# Patient Record
Sex: Male | Born: 1955 | Race: Black or African American | Hispanic: No | Marital: Single | Smoking: Never smoker
Health system: Southern US, Community
[De-identification: ages and names within clinical notes are randomized; demographics above are authoritative.]

## PROBLEM LIST (undated history)

## (undated) DIAGNOSIS — J45909 Unspecified asthma, uncomplicated: Secondary | ICD-10-CM

## (undated) DIAGNOSIS — F319 Bipolar disorder, unspecified: Secondary | ICD-10-CM

## (undated) DIAGNOSIS — E119 Type 2 diabetes mellitus without complications: Secondary | ICD-10-CM

## (undated) HISTORY — PX: HERNIA REPAIR: SHX51

---

## 2005-10-10 ENCOUNTER — Emergency Department (HOSPITAL_COMMUNITY): Admission: EM | Admit: 2005-10-10 | Discharge: 2005-10-10 | Payer: Self-pay | Admitting: Family Medicine

## 2013-02-17 ENCOUNTER — Ambulatory Visit: Payer: Self-pay | Admitting: Podiatry

## 2013-02-26 ENCOUNTER — Encounter: Payer: Self-pay | Admitting: Podiatry

## 2013-02-26 ENCOUNTER — Ambulatory Visit (INDEPENDENT_AMBULATORY_CARE_PROVIDER_SITE_OTHER): Payer: PRIVATE HEALTH INSURANCE | Admitting: Podiatry

## 2013-02-26 VITALS — BP 90/55 | HR 80 | Ht 63.0 in | Wt 145.0 lb

## 2013-02-26 DIAGNOSIS — M79609 Pain in unspecified limb: Secondary | ICD-10-CM

## 2013-02-26 DIAGNOSIS — E119 Type 2 diabetes mellitus without complications: Secondary | ICD-10-CM

## 2013-02-26 DIAGNOSIS — M79606 Pain in leg, unspecified: Secondary | ICD-10-CM

## 2013-02-26 DIAGNOSIS — B351 Tinea unguium: Secondary | ICD-10-CM | POA: Insufficient documentation

## 2013-02-26 NOTE — Patient Instructions (Signed)
Seen for hypertrophic nails. All nails debrided. Return in 3 months or as needed.  

## 2013-02-26 NOTE — Progress Notes (Signed)
Subjective: 58 y.o. year old male patient presents complaining of painful nails. Patient requests toe nails trimmed.  Stated that her blood sugar was 101 today.  Objective: Dermatologic:  All nails are thick yellow and over grown and curbing under distal end of toes.  No open lesions.  Vascular: Warm feet with palpable Posterior tibial pulses. DP is not palpable on both feet. Orthopedic:  Large bunion on both feet. Neurologic:  All epicritic and tactile sensations grossly intact.  Assessment: Dystrophic mycotic nails x 10. DM under control.   Treatment: All mycotic nails, corns, calluses debrided.

## 2013-05-26 ENCOUNTER — Ambulatory Visit: Payer: PRIVATE HEALTH INSURANCE | Admitting: Podiatry

## 2015-12-13 ENCOUNTER — Encounter (HOSPITAL_COMMUNITY): Payer: Self-pay | Admitting: Emergency Medicine

## 2015-12-13 ENCOUNTER — Emergency Department (HOSPITAL_COMMUNITY)
Admission: EM | Admit: 2015-12-13 | Discharge: 2015-12-13 | Disposition: A | Discharge status: Home / Self Care | Payer: Medicare Other | Attending: Emergency Medicine | Admitting: Emergency Medicine

## 2015-12-13 DIAGNOSIS — Z7984 Long term (current) use of oral hypoglycemic drugs: Secondary | ICD-10-CM | POA: Diagnosis not present

## 2015-12-13 DIAGNOSIS — R531 Weakness: Secondary | ICD-10-CM | POA: Insufficient documentation

## 2015-12-13 DIAGNOSIS — J45909 Unspecified asthma, uncomplicated: Secondary | ICD-10-CM | POA: Insufficient documentation

## 2015-12-13 DIAGNOSIS — E119 Type 2 diabetes mellitus without complications: Secondary | ICD-10-CM | POA: Insufficient documentation

## 2015-12-13 HISTORY — DX: Unspecified asthma, uncomplicated: J45.909

## 2015-12-13 HISTORY — DX: Type 2 diabetes mellitus without complications: E11.9

## 2015-12-13 HISTORY — DX: Bipolar disorder, unspecified: F31.9

## 2015-12-13 LAB — CBG MONITORING, ED: Glucose-Capillary: 133 mg/dL — ABNORMAL HIGH (ref 65–99)

## 2015-12-13 LAB — CBC
HCT: 32.8 % — ABNORMAL LOW (ref 39.0–52.0)
Hemoglobin: 10.5 g/dL — ABNORMAL LOW (ref 13.0–17.0)
MCH: 25.7 pg — AB (ref 26.0–34.0)
MCHC: 32 g/dL (ref 30.0–36.0)
MCV: 80.4 fL (ref 78.0–100.0)
PLATELETS: 135 10*3/uL — AB (ref 150–400)
RBC: 4.08 MIL/uL — ABNORMAL LOW (ref 4.22–5.81)
RDW: 12.7 % (ref 11.5–15.5)
WBC: 8.3 10*3/uL (ref 4.0–10.5)

## 2015-12-13 LAB — URINALYSIS, ROUTINE W REFLEX MICROSCOPIC
BILIRUBIN URINE: NEGATIVE
GLUCOSE, UA: NEGATIVE mg/dL
HGB URINE DIPSTICK: NEGATIVE
KETONES UR: NEGATIVE mg/dL
Leukocytes, UA: NEGATIVE
Nitrite: NEGATIVE
PROTEIN: NEGATIVE mg/dL
Specific Gravity, Urine: 1.018 (ref 1.005–1.030)
pH: 5 (ref 5.0–8.0)

## 2015-12-13 LAB — BASIC METABOLIC PANEL
Anion gap: 14 (ref 5–15)
BUN: 20 mg/dL (ref 6–20)
CALCIUM: 8.8 mg/dL — AB (ref 8.9–10.3)
CHLORIDE: 107 mmol/L (ref 101–111)
CO2: 18 mmol/L — AB (ref 22–32)
CREATININE: 1.42 mg/dL — AB (ref 0.61–1.24)
GFR calc Af Amer: >60 mL/min (ref >60)
GFR calc non Af Amer: 52 mL/min — ABNORMAL LOW (ref >60)
Glucose, Bld: 151 mg/dL — ABNORMAL HIGH (ref 65–99)
Potassium: 4.6 mmol/L (ref 3.5–5.1)
SODIUM: 139 mmol/L (ref 135–145)

## 2015-12-13 NOTE — ED Provider Notes (Signed)
[facility] DEPT Provider Note   CSN: [id number] Arrival date & time: 12/13/15  0203  By signing my name below, I, Arianna Nassar, attest that this documentation has been prepared under the direction and in the presence of Merryl Hacker, MD.  Electronically Signed: Julien Nordmann, ED Scribe. 12/13/15. 2:57 AM.    History   Chief Complaint Chief Complaint  Patient presents with  . Weakness    The history is provided by the patient and the EMS personnel. No language interpreter was used.   HPI Comments: Chad Mathis is a 60 y.o. male who has a PMhx of bipolar disorder and DM presents to the Emergency Department by EMS complaining of resolved, mild weakness of bilateral legs x PTA. Pt states that he was unable to move due to increased weakness in his legs. Pt has a hx of similar sx in the past after taking his medication. He is on depakote, benazepril, lopid, geodon, zocor and desyrel. Pt states the he feels asymptomatic currently. Denies fever, chest pain, shortness of breath, abdominal pain, back pain, dysuria, hematuria, urinary frequency, or urinary urgency. Pt does not have a hx of strokes in the past. Denies dizziness or syncope.  Past Medical History:  Diagnosis Date  . Asthma   . Bipolar 1 disorder ([place])   . Diabetes mellitus without complication [facility])     Patient Active Problem List   Diagnosis Date Noted  . Onychomycosis 02/26/2013  . Pain in lower limb 02/26/2013  . Diabetes mellitus ([place]) 02/26/2013    Past Surgical History:  Procedure Laterality Date  . HERNIA REPAIR         Home Medications    Prior to Admission medications   Medication Sig Start Date End Date Taking? Authorizing Provider  benazepril (LOTENSIN) 10 MG tablet  02/16/13   Historical Provider, MD  divalproex (DEPAKOTE ER) 500 MG 24 hr tablet  02/17/13   Historical Provider, MD  gemfibrozil (LOPID) 600 MG tablet  02/16/13   Historical Provider, MD  levothyroxine (SYNTHROID, LEVOTHROID)  75 MCG tablet  02/16/13   Historical Provider, MD  metFORMIN (GLUCOPHAGE-XR) 500 MG 24 hr tablet  02/16/13   Historical Provider, MD  oxybutynin (DITROPAN-XL) 10 MG 24 hr tablet  02/16/13   Historical Provider, MD  simvastatin (ZOCOR) 40 MG tablet  02/16/13   Historical Provider, MD  traZODone (DESYREL) 100 MG tablet  02/16/13   Historical Provider, MD  ziprasidone (GEODON) 60 MG capsule  02/16/13   Historical Provider, MD    Family History History reviewed. No pertinent family history.  Social History Social History  Substance Use Topics  . Smoking status: Never Smoker  . Smokeless tobacco: Never Used  . Alcohol use No     Allergies   Patient has no known allergies.   Review of Systems Review of Systems  Constitutional: Negative for fever.  Respiratory: Negative for shortness of breath.   Cardiovascular: Negative for chest pain.  Gastrointestinal: Negative for abdominal pain, diarrhea, nausea and vomiting.  Genitourinary: Negative for dysuria, frequency, hematuria and urgency.  Musculoskeletal: Negative for back pain.  Neurological: Positive for weakness. Negative for dizziness and syncope.  All other systems reviewed and are negative.    Physical Exam Updated Vital Signs BP 114/69   Pulse 73   Temp 98.2 F (36.8 C)   Resp 17   Ht 5\' 3"  (1.6 m)   Wt 147 lb (66.7 kg)   SpO2 100%   BMI 26.04 kg/m   Physical Exam  Constitutional: He is oriented to person, place, and time.  Disheveled, no acute distress  HENT:  Head: Normocephalic and atraumatic.  Poor dentition  Eyes: EOM are normal. Pupils are equal, round, and reactive to light.  Cardiovascular: Normal rate, regular rhythm and normal heart sounds.   No murmur heard. Pulmonary/Chest: Effort normal and breath sounds normal. No respiratory distress. He has no wheezes.  Abdominal: Soft. Bowel sounds are normal. There is no tenderness. There is no rebound.  Musculoskeletal: He exhibits no edema.  Neurological: He is  alert and oriented to person, place, and time.  Cranial nerves II through XII intact, 5 out of 5 strength in all 4 extremities, no dysmetria to finger-nose-finger, no drift, normal gait  Skin: Skin is warm and dry.  Psychiatric: He has a normal mood and affect.  Nursing note and vitals reviewed.    ED Treatments / Results  DIAGNOSTIC STUDIES: Oxygen Saturation is 98% on RA, normal by my interpretation.  COORDINATION OF CARE:  2:57 AM Discussed treatment plan with pt at bedside and pt agreed to plan.  Labs (all labs ordered are listed, but only abnormal results are displayed) Labs Reviewed  BASIC METABOLIC PANEL - Abnormal; Notable for the following:       Result Value   CO2 18 (*)    Glucose, Bld 151 (*)    Creatinine, Ser 1.42 (*)    Calcium 8.8 (*)    GFR calc non Af Amer 52 (*)    All other components within normal limits  CBC - Abnormal; Notable for the following:    RBC 4.08 (*)    Hemoglobin 10.5 (*)    HCT 32.8 (*)    MCH 25.7 (*)    Platelets 135 (*)    All other components within normal limits  CBG MONITORING, ED - Abnormal; Notable for the following:    Glucose-Capillary 133 (*)    All other components within normal limits  URINALYSIS, ROUTINE W REFLEX MICROSCOPIC    EKG  EKG Interpretation  Date/Time:  Wednesday December 13 2015 02:22:37 EST Ventricular Rate:  90 PR Interval:    QRS Duration: 87 QT Interval:  353 QTC Calculation: 432 R Axis:   57 Text Interpretation:  Sinus rhythm RSR' in V1 or V2, probably normal variant Confirmed by Zackarie Chason  MD, Loma Sousa ([id number]) on 12/13/2015 2:43:01 AM       Radiology No results found.  Procedures Procedures (including critical care time)  Medications Ordered in ED Medications - No data to display   Initial Impression / Assessment and Plan / ED Course  I have reviewed the triage vital signs and the nursing notes.  Pertinent labs & imaging results that were available during my care of the patient were  reviewed by me and considered in my medical decision making (see chart for details).  Clinical Course     Patient presents with generalized weakness. Reports he had difficulty getting up in the bathroom. Denies lateralizing weakness. Neurologically intact. Noted to be mildly hypotensive by EMS. Her see fluids. On my evaluation is normotensive and asymptomatic. Neurologic exam is reassuring. Basic labwork obtained without significant abnormality. Mild bump in creatinine and anemia. No baseline for comparison. Patient has been without complaint and at his baseline. He is ambulatory independently. Doubt acute emergent process.  After history, exam, and medical workup I feel the patient has been appropriately medically screened and is safe for discharge home. Pertinent diagnoses were discussed with the patient. Patient was given return precautions.  I personally performed the services described in this documentation, which was scribed in my presence. The recorded information has been reviewed and is accurate.  Final Clinical Impressions(s) / ED Diagnoses   Final diagnoses:  Weakness    New Prescriptions New Prescriptions   No medications on file     Merryl Hacker, MD 12/13/15 [phone]

## 2015-12-13 NOTE — ED Triage Notes (Addendum)
Per EMS: Pt at a group home for bipolar disorder. EMS called out for stroke. EMS determined it was generalized weakness. Pt has equal grip strength, face is symmetrical, and no other neurological changes. Pt was hypotensive upon arrival. Pt received 100 mL fluid and last pressure systolic was 871/95.

## 2015-12-13 NOTE — Discharge Instructions (Signed)
You were seen today for bilateral lower extremity weakness. Workup is reassuring. It does not appear having a stroke based on your exam. Your labs are reassuring. Follow-up with your primary physician.

## 2015-12-13 NOTE — ED Notes (Signed)
Pt verbalized understanding of d/c instructions and has no further questions. Pt is stable, A&Ox4, VSS.  

## 2015-12-13 NOTE — ED Notes (Signed)
Pt ambulated in the hallway without assistance and with steady gait.

## 2018-06-04 ENCOUNTER — Other Ambulatory Visit: Payer: Self-pay

## 2018-06-04 ENCOUNTER — Emergency Department (HOSPITAL_COMMUNITY): Payer: Medicare HMO

## 2018-06-04 ENCOUNTER — Inpatient Hospital Stay (HOSPITAL_COMMUNITY): Payer: Medicare HMO

## 2018-06-04 ENCOUNTER — Encounter (HOSPITAL_COMMUNITY): Payer: Self-pay

## 2018-06-04 ENCOUNTER — Inpatient Hospital Stay (HOSPITAL_COMMUNITY)
Admission: EM | Admit: 2018-06-04 | Discharge: 2018-06-20 | DRG: 674 | Disposition: A | Discharge status: Skilled Nursing Facility | Payer: Medicare HMO | Attending: Internal Medicine | Admitting: Internal Medicine

## 2018-06-04 DIAGNOSIS — Z20828 Contact with and (suspected) exposure to other viral communicable diseases: Secondary | ICD-10-CM | POA: Diagnosis present

## 2018-06-04 DIAGNOSIS — R6510 Systemic inflammatory response syndrome (SIRS) of non-infectious origin without acute organ dysfunction: Secondary | ICD-10-CM

## 2018-06-04 DIAGNOSIS — M6282 Rhabdomyolysis: Secondary | ICD-10-CM | POA: Diagnosis present

## 2018-06-04 DIAGNOSIS — N185 Chronic kidney disease, stage 5: Secondary | ICD-10-CM | POA: Diagnosis not present

## 2018-06-04 DIAGNOSIS — N179 Acute kidney failure, unspecified: Principal | ICD-10-CM

## 2018-06-04 DIAGNOSIS — N186 End stage renal disease: Secondary | ICD-10-CM | POA: Diagnosis not present

## 2018-06-04 DIAGNOSIS — E872 Acidosis, unspecified: Secondary | ICD-10-CM

## 2018-06-04 DIAGNOSIS — E11649 Type 2 diabetes mellitus with hypoglycemia without coma: Secondary | ICD-10-CM | POA: Diagnosis not present

## 2018-06-04 DIAGNOSIS — R7989 Other specified abnormal findings of blood chemistry: Secondary | ICD-10-CM

## 2018-06-04 DIAGNOSIS — E861 Hypovolemia: Secondary | ICD-10-CM | POA: Diagnosis present

## 2018-06-04 DIAGNOSIS — Z7984 Long term (current) use of oral hypoglycemic drugs: Secondary | ICD-10-CM | POA: Diagnosis not present

## 2018-06-04 DIAGNOSIS — N250 Renal osteodystrophy: Secondary | ICD-10-CM | POA: Diagnosis present

## 2018-06-04 DIAGNOSIS — I120 Hypertensive chronic kidney disease with stage 5 chronic kidney disease or end stage renal disease: Secondary | ICD-10-CM | POA: Diagnosis present

## 2018-06-04 DIAGNOSIS — E875 Hyperkalemia: Secondary | ICD-10-CM

## 2018-06-04 DIAGNOSIS — I959 Hypotension, unspecified: Secondary | ICD-10-CM | POA: Diagnosis present

## 2018-06-04 DIAGNOSIS — E119 Type 2 diabetes mellitus without complications: Secondary | ICD-10-CM

## 2018-06-04 DIAGNOSIS — Z7982 Long term (current) use of aspirin: Secondary | ICD-10-CM

## 2018-06-04 DIAGNOSIS — G4700 Insomnia, unspecified: Secondary | ICD-10-CM | POA: Diagnosis present

## 2018-06-04 DIAGNOSIS — F319 Bipolar disorder, unspecified: Secondary | ICD-10-CM | POA: Diagnosis present

## 2018-06-04 DIAGNOSIS — Z79890 Hormone replacement therapy (postmenopausal): Secondary | ICD-10-CM

## 2018-06-04 DIAGNOSIS — D72829 Elevated white blood cell count, unspecified: Secondary | ICD-10-CM | POA: Diagnosis present

## 2018-06-04 DIAGNOSIS — R34 Anuria and oliguria: Secondary | ICD-10-CM | POA: Diagnosis present

## 2018-06-04 DIAGNOSIS — D638 Anemia in other chronic diseases classified elsewhere: Secondary | ICD-10-CM

## 2018-06-04 DIAGNOSIS — D631 Anemia in chronic kidney disease: Secondary | ICD-10-CM | POA: Diagnosis present

## 2018-06-04 DIAGNOSIS — F209 Schizophrenia, unspecified: Secondary | ICD-10-CM | POA: Diagnosis present

## 2018-06-04 DIAGNOSIS — E876 Hypokalemia: Secondary | ICD-10-CM | POA: Diagnosis present

## 2018-06-04 DIAGNOSIS — H548 Legal blindness, as defined in USA: Secondary | ICD-10-CM | POA: Diagnosis present

## 2018-06-04 DIAGNOSIS — E1122 Type 2 diabetes mellitus with diabetic chronic kidney disease: Secondary | ICD-10-CM | POA: Diagnosis present

## 2018-06-04 DIAGNOSIS — Z01810 Encounter for preprocedural cardiovascular examination: Secondary | ICD-10-CM | POA: Diagnosis not present

## 2018-06-04 DIAGNOSIS — N183 Chronic kidney disease, stage 3 (moderate): Secondary | ICD-10-CM | POA: Diagnosis not present

## 2018-06-04 DIAGNOSIS — R945 Abnormal results of liver function studies: Secondary | ICD-10-CM | POA: Diagnosis not present

## 2018-06-04 LAB — CBC WITH DIFFERENTIAL/PLATELET
Abs Immature Granulocytes: 0.07 10*3/uL (ref 0.00–0.07)
Basophils Absolute: 0 10*3/uL (ref 0.0–0.1)
Basophils Relative: 0 %
Eosinophils Absolute: 0.1 10*3/uL (ref 0.0–0.5)
Eosinophils Relative: 1 %
HCT: 39.4 % (ref 39.0–52.0)
Hemoglobin: 12.1 g/dL — ABNORMAL LOW (ref 13.0–17.0)
Immature Granulocytes: 1 %
Lymphocytes Relative: 4 %
Lymphs Abs: 0.6 10*3/uL — ABNORMAL LOW (ref 0.7–4.0)
MCH: 25.2 pg — ABNORMAL LOW (ref 26.0–34.0)
MCHC: 30.7 g/dL (ref 30.0–36.0)
MCV: 81.9 fL (ref 80.0–100.0)
Monocytes Absolute: 0.8 10*3/uL (ref 0.1–1.0)
Monocytes Relative: 6 %
Neutro Abs: 12 10*3/uL — ABNORMAL HIGH (ref 1.7–7.7)
Neutrophils Relative %: 88 %
Platelets: 242 10*3/uL (ref 150–400)
RBC: 4.81 MIL/uL (ref 4.22–5.81)
RDW: 14.1 % (ref 11.5–15.5)
WBC: 13.5 10*3/uL — ABNORMAL HIGH (ref 4.0–10.5)
nRBC: 0 % (ref 0.0–0.2)

## 2018-06-04 LAB — COMPREHENSIVE METABOLIC PANEL
ALT: 184 U/L — ABNORMAL HIGH (ref 0–44)
AST: 262 U/L — ABNORMAL HIGH (ref 15–41)
Albumin: 3.8 g/dL (ref 3.5–5.0)
Alkaline Phosphatase: 48 U/L (ref 38–126)
BUN: 88 mg/dL — ABNORMAL HIGH (ref 8–23)
CO2: <7 mmol/L — ABNORMAL LOW (ref 22–32)
Calcium: 8.1 mg/dL — ABNORMAL LOW (ref 8.9–10.3)
Chloride: 97 mmol/L — ABNORMAL LOW (ref 98–111)
Creatinine, Ser: 8.86 mg/dL — ABNORMAL HIGH (ref 0.61–1.24)
GFR calc Af Amer: 7 mL/min — ABNORMAL LOW (ref >60)
GFR calc non Af Amer: 6 mL/min — ABNORMAL LOW (ref >60)
Glucose, Bld: 66 mg/dL — ABNORMAL LOW (ref 70–99)
Potassium: 7 mmol/L (ref 3.5–5.1)
Sodium: 133 mmol/L — ABNORMAL LOW (ref 135–145)
Total Bilirubin: 0.7 mg/dL (ref 0.3–1.2)
Total Protein: 7.3 g/dL (ref 6.5–8.1)

## 2018-06-04 LAB — BASIC METABOLIC PANEL
Anion gap: 28 — ABNORMAL HIGH (ref 5–15)
BUN: 84 mg/dL — ABNORMAL HIGH (ref 8–23)
CO2: 9 mmol/L — ABNORMAL LOW (ref 22–32)
Calcium: 7.4 mg/dL — ABNORMAL LOW (ref 8.9–10.3)
Chloride: 99 mmol/L (ref 98–111)
Creatinine, Ser: 8.35 mg/dL — ABNORMAL HIGH (ref 0.61–1.24)
GFR calc Af Amer: 7 mL/min — ABNORMAL LOW (ref >60)
GFR calc non Af Amer: 6 mL/min — ABNORMAL LOW (ref >60)
Glucose, Bld: 206 mg/dL — ABNORMAL HIGH (ref 70–99)
Potassium: 6.3 mmol/L (ref 3.5–5.1)
Sodium: 136 mmol/L (ref 135–145)

## 2018-06-04 LAB — GLUCOSE, CAPILLARY
Glucose-Capillary: 75 mg/dL (ref 70–99)
Glucose-Capillary: 76 mg/dL (ref 70–99)

## 2018-06-04 LAB — PROTIME-INR
INR: 1.1 (ref 0.8–1.2)
Prothrombin Time: 14.4 seconds (ref 11.4–15.2)

## 2018-06-04 LAB — VALPROIC ACID LEVEL: Valproic Acid Lvl: <10 ug/mL — ABNORMAL LOW (ref 50.0–100.0)

## 2018-06-04 LAB — CBG MONITORING, ED
Glucose-Capillary: 67 mg/dL — ABNORMAL LOW (ref 70–99)
Glucose-Capillary: 275 mg/dL — ABNORMAL HIGH (ref 70–99)
Glucose-Capillary: 238 mg/dL — ABNORMAL HIGH (ref 70–99)

## 2018-06-04 LAB — LACTIC ACID, PLASMA
Lactic Acid, Venous: 6.2 mmol/L (ref 0.5–1.9)
Lactic Acid, Venous: 7.5 mmol/L (ref 0.5–1.9)

## 2018-06-04 LAB — SARS CORONAVIRUS 2 BY RT PCR (HOSPITAL ORDER, PERFORMED IN ~~LOC~~ HOSPITAL LAB): SARS Coronavirus 2: NEGATIVE

## 2018-06-04 LAB — POC OCCULT BLOOD, ED: Fecal Occult Bld: NEGATIVE

## 2018-06-04 LAB — PROCALCITONIN: Procalcitonin: 0.58 ng/mL

## 2018-06-04 LAB — APTT: aPTT: 29 seconds (ref 24–36)

## 2018-06-04 MED ORDER — SODIUM CHLORIDE 0.9 % IV SOLN: 100.0000 mL | INTRAVENOUS | Status: DC | PRN

## 2018-06-04 MED ORDER — INSULIN ASPART 100 UNIT/ML IV SOLN
5.0000 [IU] | Freq: Once | INTRAVENOUS | Status: AC
  Administered 2018-06-04: 5 [IU] via INTRAVENOUS
  Filled 2018-06-04: qty 0.05

## 2018-06-04 MED ORDER — FLUTICASONE PROPIONATE 50 MCG/ACT NA SUSP
1.0000 | Freq: Every day | NASAL | Status: DC
  Administered 2018-06-05 – 2018-06-20 (×15): 1 via NASAL
  Filled 2018-06-04: qty 16

## 2018-06-04 MED ORDER — HEPARIN SODIUM (PORCINE) 5000 UNIT/ML IJ SOLN
5000.0000 [IU] | Freq: Three times a day (TID) | INTRAMUSCULAR | Status: AC
  Administered 2018-06-04 – 2018-06-15 (×33): 5000 [IU] via SUBCUTANEOUS
  Filled 2018-06-04 (×32): qty 1

## 2018-06-04 MED ORDER — PIPERACILLIN-TAZOBACTAM IN DEX 2-0.25 GM/50ML IV SOLN
2.2500 g | Freq: Three times a day (TID) | INTRAVENOUS | Status: DC
  Administered 2018-06-04 – 2018-06-06 (×6): 2.25 g via INTRAVENOUS
  Filled 2018-06-04 (×10): qty 50

## 2018-06-04 MED ORDER — LIDOCAINE HCL 1 % IJ SOLN
INTRAMUSCULAR | Status: AC
  Filled 2018-06-04: qty 20

## 2018-06-04 MED ORDER — SODIUM BICARBONATE 8.4 % IV SOLN
50.0000 meq | Freq: Once | INTRAVENOUS | Status: AC
  Administered 2018-06-04: 50 meq via INTRAVENOUS
  Filled 2018-06-04: qty 50

## 2018-06-04 MED ORDER — ACETAMINOPHEN 650 MG RE SUPP: 650.0000 mg | Freq: Four times a day (QID) | RECTAL | Status: DC | PRN

## 2018-06-04 MED ORDER — CALCIUM GLUCONATE-NACL 1-0.675 GM/50ML-% IV SOLN
1.0000 g | Freq: Once | INTRAVENOUS | Status: AC
  Administered 2018-06-04: 1000 mg via INTRAVENOUS
  Filled 2018-06-04: qty 50

## 2018-06-04 MED ORDER — ACETAMINOPHEN 325 MG PO TABS: 650.0000 mg | ORAL_TABLET | Freq: Four times a day (QID) | ORAL | Status: DC | PRN

## 2018-06-04 MED ORDER — LIDOCAINE HCL (PF) 1 % IJ SOLN
10.0000 mL | Freq: Once | INTRAMUSCULAR | Status: AC
  Administered 2018-06-04: 12:00:00 10 mL via INTRADERMAL

## 2018-06-04 MED ORDER — TRAZODONE HCL 50 MG PO TABS
150.0000 mg | ORAL_TABLET | Freq: Every day | ORAL | Status: DC
  Administered 2018-06-05 – 2018-06-18 (×14): 150 mg via ORAL
  Filled 2018-06-04 (×15): qty 3

## 2018-06-04 MED ORDER — SODIUM CHLORIDE 0.9 % IV BOLUS
1000.0000 mL | Freq: Once | INTRAVENOUS | Status: AC
  Administered 2018-06-04: 1000 mL via INTRAVENOUS

## 2018-06-04 MED ORDER — HEPARIN SODIUM (PORCINE) 1000 UNIT/ML DIALYSIS
1000.0000 [IU] | INTRAMUSCULAR | Status: DC | PRN
  Administered 2018-06-04: 20:00:00 2400 [IU] via INTRAVENOUS_CENTRAL
  Filled 2018-06-04 (×2): qty 1

## 2018-06-04 MED ORDER — ALTEPLASE 2 MG IJ SOLR
2.0000 mg | Freq: Once | INTRAMUSCULAR | Status: DC | PRN
  Filled 2018-06-04: qty 2

## 2018-06-04 MED ORDER — STERILE WATER FOR INJECTION IV SOLN
INTRAVENOUS | Status: DC
  Administered 2018-06-04 (×2): via INTRAVENOUS
  Filled 2018-06-04 (×4): qty 850

## 2018-06-04 MED ORDER — LACTATED RINGERS IV BOLUS (SEPSIS)
1000.0000 mL | Freq: Once | INTRAVENOUS | Status: DC
  Administered 2018-06-04: 10:00:00 1000 mL via INTRAVENOUS

## 2018-06-04 MED ORDER — SODIUM ZIRCONIUM CYCLOSILICATE 10 G PO PACK
10.0000 g | PACK | Freq: Two times a day (BID) | ORAL | Status: DC
  Administered 2018-06-04: 10 g via ORAL
  Filled 2018-06-04 (×3): qty 1

## 2018-06-04 MED ORDER — CALCIUM GLUCONATE 10 % IV SOLN: 1.0000 g | Freq: Once | INTRAVENOUS | Status: DC

## 2018-06-04 MED ORDER — LIDOCAINE HCL (PF) 1 % IJ SOLN
5.0000 mL | INTRAMUSCULAR | Status: DC | PRN
  Filled 2018-06-04: qty 5

## 2018-06-04 MED ORDER — PENTAFLUOROPROP-TETRAFLUOROETH EX AERO: 1.0000 "application " | INHALATION_SPRAY | CUTANEOUS | Status: DC | PRN

## 2018-06-04 MED ORDER — SODIUM CHLORIDE 0.9 % IV BOLUS: 500.0000 mL | Freq: Once | INTRAVENOUS | Status: DC

## 2018-06-04 MED ORDER — CHLORHEXIDINE GLUCONATE CLOTH 2 % EX PADS
6.0000 | MEDICATED_PAD | Freq: Every day | CUTANEOUS | Status: DC
  Administered 2018-06-06 – 2018-06-14 (×8): 6 via TOPICAL

## 2018-06-04 MED ORDER — PIPERACILLIN-TAZOBACTAM 3.375 G IVPB 30 MIN
3.3750 g | Freq: Once | INTRAVENOUS | Status: AC
  Administered 2018-06-04: 14:00:00 3.375 g via INTRAVENOUS
  Filled 2018-06-04: qty 50

## 2018-06-04 MED ORDER — HEPARIN SODIUM (PORCINE) 1000 UNIT/ML IJ SOLN
INTRAMUSCULAR | Status: AC
  Administered 2018-06-04: 2400 [IU] via INTRAVENOUS_CENTRAL
  Filled 2018-06-04: qty 4

## 2018-06-04 MED ORDER — FERROUS SULFATE 325 (65 FE) MG PO TABS
325.0000 mg | ORAL_TABLET | Freq: Every day | ORAL | Status: DC
  Administered 2018-06-05 – 2018-06-18 (×13): 325 mg via ORAL
  Filled 2018-06-04 (×13): qty 1

## 2018-06-04 MED ORDER — ASPIRIN 81 MG PO CHEW
81.0000 mg | CHEWABLE_TABLET | Freq: Every day | ORAL | Status: DC
  Administered 2018-06-05 – 2018-06-20 (×14): 81 mg via ORAL
  Filled 2018-06-04 (×15): qty 1

## 2018-06-04 MED ORDER — ZIPRASIDONE HCL 40 MG PO CAPS
60.0000 mg | ORAL_CAPSULE | Freq: Every day | ORAL | Status: DC
  Administered 2018-06-05 – 2018-06-19 (×12): 60 mg via ORAL
  Filled 2018-06-04 (×17): qty 1

## 2018-06-04 MED ORDER — SODIUM CHLORIDE 0.9 % IV SOLN
1.0000 g | Freq: Once | INTRAVENOUS | Status: AC
  Administered 2018-06-04: 12:00:00 1 g via INTRAVENOUS
  Filled 2018-06-04: qty 10

## 2018-06-04 MED ORDER — LIDOCAINE-PRILOCAINE 2.5-2.5 % EX CREA
1.0000 "application " | TOPICAL_CREAM | CUTANEOUS | Status: DC | PRN
  Filled 2018-06-04: qty 5

## 2018-06-04 MED ORDER — INSULIN ASPART 100 UNIT/ML ~~LOC~~ SOLN
0.0000 [IU] | SUBCUTANEOUS | Status: DC
  Administered 2018-06-05 – 2018-06-14 (×3): 1 [IU] via SUBCUTANEOUS

## 2018-06-04 MED ORDER — LACTATED RINGERS IV BOLUS
1000.0000 mL | Freq: Once | INTRAVENOUS | Status: AC
  Administered 2018-06-04: 11:00:00 1000 mL via INTRAVENOUS

## 2018-06-04 MED ORDER — DIVALPROEX SODIUM 125 MG PO DR TAB
125.0000 mg | DELAYED_RELEASE_TABLET | Freq: Every day | ORAL | Status: DC
  Administered 2018-06-04 – 2018-06-19 (×15): 125 mg via ORAL
  Filled 2018-06-04 (×16): qty 1

## 2018-06-04 MED ORDER — ONDANSETRON HCL 4 MG/2ML IJ SOLN: 4.0000 mg | Freq: Four times a day (QID) | INTRAMUSCULAR | Status: DC | PRN

## 2018-06-04 MED ORDER — DEXTROSE 50 % IV SOLN
2.0000 | Freq: Once | INTRAVENOUS | Status: AC
  Administered 2018-06-04: 11:00:00 100 mL via INTRAVENOUS
  Filled 2018-06-04: qty 100

## 2018-06-04 NOTE — ED Notes (Signed)
Right IJ dialysis catheter placed by Dr. Kathrynn Humble at patient bedside using sterile technique. See procedural records. Patient tolerated well.

## 2018-06-04 NOTE — Progress Notes (Signed)
Report from HD at 2023 and returned to floor at 2041 notified by secretary. Per HD report, patient ran 2.5 hours, did not remove fluids (just clean blood).   Confirmed with pharmacy at 2137 NA+Bicarb in sterile H2O compatible with Zosyn.   During shift assessment patient had difficulty following commands; quite drowsy and needed coaching. Patient repeatedly answered "yes ma'am" when unable to answer question such as date/situation correctly. Able to state name of legal guardian.   Ginger ale given with night meds, CBG 76 and can not have oj and apple juice due to elevated K+.   Called pharm at 2233 inquire about Zosyn, pharm now sending.   Paged Bodenheimer at 2327 notifying last K+ 6.3 at 1504 prior to HD, however no new labs drawn. Spoke with [employer] via phone and stated ok to recheck labs in AM as scheduled and will add lactic. Made aware of CBG and drowsiness therefore poor intake. Stated can use D50, but will avoid D10 due to fluid overload.   Paged NP Bodenheimer at [facility], that patient drowsy, although arousable, poor attention and can not take [facility] as scheduled. Bodenheimer indicated ok to hold Lokelma.   Ginger ale given after 76, 75 and 73 CBG as much as patient could tolerate due to drowsiness, needed some assistance with drinking.   CBG 79 at 0329. Patient aroused to drink 2/3 Sprite. Denied pain and GI di/scomfort. More arousable and able to follow commands. Patient much more alert during shift hand-off.

## 2018-06-04 NOTE — ED Notes (Signed)
CRITICAL VALUE STICKER  CRITICAL VALUE:Lactic 7.5  RECEIVER (on-site recipient of call): [place] NOTIFIED: 1211  MESSENGER (representative from lab):lab MD NOTIFIED: Nanavati MD  TIME OF NOTIFICATION:1211  RESPONSE: at bedside

## 2018-06-04 NOTE — ED Notes (Signed)
ED TO INPATIENT HANDOFF REPORT  Name/Age/Gender Chad Mathis 63 y.o. male  Code Status   Home/SNF/Other [place]  Chief Complaint weakness hyperglycemic  Level of Care/Admitting Diagnosis ED Disposition    ED Disposition Condition [facility] Area: [place] [[id number]]  Level of Care: Progressive [102]  Covid Evaluation: Confirmed COVID Negative  Diagnosis: Acute renal failure [facility]) [[id number]]  Admitting Physician: Mariel Aloe [phone]  Attending Physician: Mariel Aloe [phone]  Estimated length of stay: past midnight tomorrow  Certification:: I certify this patient will need inpatient services for at least 2 midnights  PT Class (Do Not Modify): Inpatient [101]  PT Acc Code (Do Not Modify): Private [1]       Medical History Past Medical History:  Diagnosis Date  . Asthma   . Bipolar 1 disorder ([place])   . Diabetes mellitus without complication ([place])     Allergies No Known Allergies  IV Location/Drains/Wounds Patient Lines/Drains/Airways Status   Active Line/Drains/Airways    Name:   Placement date:   Placement time:   Site:   Days:   Peripheral IV 06/04/18 Right Hand   06/04/18    0958    Hand   less than 1   Peripheral IV 06/04/18 Left Antecubital   06/04/18    -    Antecubital   less than 1          Labs/Imaging Results for orders placed or performed during the hospital encounter of 06/04/18 (from the past 48 hour(s))  POC CBG, ED     Status: Abnormal   Collection Time: 06/04/18  9:23 AM  Result Value Ref Range   Glucose-Capillary 67 (L) 70 - 99 mg/dL  Comprehensive metabolic panel     Status: Abnormal   Collection Time: 06/04/18  9:57 AM  Result Value Ref Range   Sodium 133 (L) 135 - 145 mmol/L   Potassium 7.0 (HH) 3.5 - 5.1 mmol/L    Comment: NO VISIBLE HEMOLYSIS CRITICAL RESULT CALLED TO, READ BACK BY AND VERIFIED WITH: KHALID,A. RN AT 1029 06/04/18 MULLINS,T    Chloride 97 (L) 98 - 111 mmol/L   CO2 <7  (L) 22 - 32 mmol/L   Glucose, Bld 66 (L) 70 - 99 mg/dL   BUN 88 (H) 8 - 23 mg/dL   Creatinine, Ser 8.86 (H) 0.61 - 1.24 mg/dL   Calcium 8.1 (L) 8.9 - 10.3 mg/dL   Total Protein 7.3 6.5 - 8.1 g/dL   Albumin 3.8 3.5 - 5.0 g/dL   AST 262 (H) 15 - 41 U/L   ALT 184 (H) 0 - 44 U/L   Alkaline Phosphatase 48 38 - 126 U/L   Total Bilirubin 0.7 0.3 - 1.2 mg/dL   GFR calc non Af Amer 6 (L) >60 mL/min   GFR calc Af Amer 7 (L) >60 mL/min   Anion gap NOT CALCULATED 5 - 15    Comment: Performed at [facility], [place] [address], [place], [place] [address]  CBC WITH DIFFERENTIAL     Status: Abnormal   Collection Time: 06/04/18  9:57 AM  Result Value Ref Range   WBC 13.5 (H) 4.0 - 10.5 K/uL    Comment: WHITE COUNT CONFIRMED ON SMEAR   RBC 4.81 4.22 - 5.81 MIL/uL   Hemoglobin 12.1 (L) 13.0 - 17.0 g/dL   HCT 39.4 39.0 - 52.0 %   MCV 81.9 80.0 - 100.0 fL   MCH 25.2 (L) 26.0 -  34.0 pg   MCHC 30.7 30.0 - 36.0 g/dL   RDW 14.1 11.5 - 15.5 %   Platelets 242 150 - 400 K/uL   nRBC 0.0 0.0 - 0.2 %   Neutrophils Relative % 88 %   Neutro Abs 12.0 (H) 1.7 - 7.7 K/uL   Lymphocytes Relative 4 %   Lymphs Abs 0.6 (L) 0.7 - 4.0 K/uL   Monocytes Relative 6 %   Monocytes Absolute 0.8 0.1 - 1.0 K/uL   Eosinophils Relative 1 %   Eosinophils Absolute 0.1 0.0 - 0.5 K/uL   Basophils Relative 0 %   Basophils Absolute 0.0 0.0 - 0.1 K/uL   WBC Morphology VACUOLATED NEUTROPHILS    Immature Granulocytes 1 %   Abs Immature Granulocytes 0.07 0.00 - 0.07 K/uL    Comment: Performed at [facility], [place] [address], [place], [place] [address]  Lactic acid, plasma     Status: Abnormal   Collection Time: 06/04/18 10:00 AM  Result Value Ref Range   Lactic Acid, Venous 6.2 (HH) 0.5 - 1.9 mmol/L    Comment: CRITICAL RESULT CALLED TO, READ BACK BY AND VERIFIED WITH: KHALID,A. RN AT 1029 06/04/18 MULLINS,T Performed at [facility], [place] [address] [place], [place]  [id number]   SARS Coronavirus 2 (CEPHEID - Performed in [place] hospital lab), Hosp Order     Status: None   Collection Time: 06/04/18 10:00 AM  Result Value Ref Range   SARS Coronavirus 2 NEGATIVE NEGATIVE    Comment: (NOTE) If result is NEGATIVE SARS-CoV-2 target nucleic acids are NOT DETECTED. The SARS-CoV-2 RNA is generally detectable in upper and lower  respiratory specimens during the acute phase of infection. The lowest  concentration of SARS-CoV-2 viral copies this assay can detect is 250  copies / mL. A negative result does not preclude SARS-CoV-2 infection  and should not be used as the sole basis for treatment or other  patient management decisions.  A negative result may occur with  improper specimen collection / handling, submission of specimen other  than nasopharyngeal swab, presence of viral mutation(s) within the  areas targeted by this assay, and inadequate number of viral copies  (<250 copies / mL). A negative result must be combined with clinical  observations, patient history, and epidemiological information. If result is POSITIVE SARS-CoV-2 target nucleic acids are DETECTED. The SARS-CoV-2 RNA is generally detectable in upper and lower  respiratory specimens dur ing the acute phase of infection.  Positive  results are indicative of active infection with SARS-CoV-2.  Clinical  correlation with patient history and other diagnostic information is  necessary to determine patient infection status.  Positive results do  not rule out bacterial infection or co-infection with other viruses. If result is PRESUMPTIVE POSTIVE SARS-CoV-2 nucleic acids MAY BE PRESENT.   A presumptive positive result was obtained on the submitted specimen  and confirmed on repeat testing.  While 2019 novel coronavirus  (SARS-CoV-2) nucleic acids may be present in the submitted sample  additional confirmatory testing may be necessary for epidemiological  and / or clinical management purposes   to differentiate between  SARS-CoV-2 and other Sarbecovirus currently known to infect humans.  If clinically indicated additional testing with an alternate test  methodology [phone]) is advised. The SARS-CoV-2 RNA is generally  detectable in upper and lower respiratory sp ecimens during the acute  phase of infection. The expected result is Negative. Fact Sheet for Patients:  StrictlyIdeas.no Fact Sheet for Healthcare Providers:  BankingDealers.co.za This test is not yet approved or cleared by the [place] and has been authorized for detection and/or diagnosis of SARS-CoV-2 by FDA under an Emergency Use Authorization (EUA).  This EUA will remain in effect (meaning this test can be used) for the duration of the COVID-19 declaration under Section 564(b)(1) of the Act, 21 U.S.C. section 360bbb-3(b)(1), unless the authorization is terminated or revoked sooner. Performed at [facility], [place] [address], [place], [place] [address]   Protime-INR     Status: None   Collection Time: 06/04/18 10:44 AM  Result Value Ref Range   Prothrombin Time 14.4 11.4 - 15.2 seconds   INR 1.1 0.8 - 1.2    Comment: (NOTE) INR goal varies based on device and disease states. Performed at [facility], [place] [address], [place], [place] [address]   APTT     Status: None   Collection Time: 06/04/18 10:44 AM  Result Value Ref Range   aPTT 29 24 - 36 seconds    Comment: Performed at [facility], [place] [address] [place], [place] [address]  POC CBG, ED     Status: Abnormal   Collection Time: 06/04/18 11:38 AM  Result Value Ref Range   Glucose-Capillary 275 (H) 70 - 99 mg/dL  Valproic acid level     Status: Abnormal   Collection Time: 06/04/18 11:41 AM  Result Value Ref Range   Valproic Acid Lvl <10 (L) 50.0 - 100.0 ug/mL    Comment: Performed at [facility], [place] [address], [place], [place] [address]  Lactic acid, plasma     Status: Abnormal   Collection Time: 06/04/18 11:42 AM  Result Value Ref Range   Lactic Acid, Venous 7.5 (HH) 0.5 - 1.9 mmol/L    Comment: CRITICAL RESULT CALLED TO, READ BACK BY AND VERIFIED WITH: KHALID,A. RN AT 1210 06/04/18 MULLINS,T Performed at [facility], [place] [address] [place], [place] [address]   CBG monitoring, ED     Status: Abnormal   Collection Time: 06/04/18 12:34 PM  Result Value Ref Range   Glucose-Capillary 238 (H) 70 - 99 mg/dL   Dg Chest Port 1 View  Result Date: 06/04/2018 CLINICAL DATA:  Worsening nausea vomiting for 3 days EXAM: PORTABLE CHEST 1 VIEW COMPARISON:  None. FINDINGS: There is a right jugular central venous catheter with the tip projecting over the SVC. There is no focal parenchymal opacity. There is no pleural effusion or pneumothorax. The heart and mediastinal contours are unremarkable. There is gaseous distension of the stomach. The osseous structures are unremarkable. IMPRESSION: No active disease. Electronically Signed   By: Kathreen Devoid   On: 06/04/2018 12:47    Pending Labs Unresulted Labs (From admission, onward)    Start     Ordered   06/05/18 0500  Procalcitonin  Daily,   R     06/04/18 1232   06/05/18 0500  Creatinine, serum  Daily,   R     06/04/18 1250   06/04/18 1233  Procalcitonin - Baseline  ONCE - STAT,   STAT     06/04/18 1232   06/04/18 0939  Blood Culture (routine x 2)  BLOOD CULTURE X 2,   STAT     06/04/18 0939   06/04/18 0939  Urinalysis, Routine w reflex microscopic  ONCE - STAT,   STAT     06/04/18 0939   06/04/18 0939  Urine culture  ONCE - STAT,  STAT     06/04/18 0939   Signed and Held  HIV antibody (Routine Testing)  Tomorrow morning,   R     Signed and Held   Signed and Held  Comprehensive metabolic panel  Tomorrow morning,   R     Signed and Held   Signed and Held  CBC  Tomorrow morning,   R     Signed and Held   Signed and Held  Hemoglobin  A1c  Tomorrow morning,   R    Comments:  To assess prior glycemic control    Signed and Held          Vitals/Pain Today's Vitals   06/04/18 1215 06/04/18 1230 06/04/18 1245 06/04/18 1315  BP: (!) 145/73 136/75 (!) 141/75 113/64  Pulse: (!) 124 (!) 121 (!) 125 (!) 120  Resp: (!) 23 20 (!) 23 (!) 21  Temp:      TempSrc:      SpO2: 98% 99% 100% 99%    Isolation Precautions No active isolations  Medications Medications  sodium zirconium cyclosilicate (LOKELMA) packet 10 g (10 g Oral Given 06/04/18 1101)  sodium bicarbonate 150 mEq in sterile water 1,000 mL infusion ( Intravenous New Bag/Given 06/04/18 1250)  lidocaine (XYLOCAINE) 1 % (with pres) injection (  See Procedure Record 06/04/18 1221)  ondansetron (ZOFRAN) injection 4 mg (has no administration in time range)  piperacillin-tazobactam (ZOSYN) IVPB 3.375 g (has no administration in time range)    Followed by  piperacillin-tazobactam (ZOSYN) IVPB 2.25 g (has no administration in time range)  sodium chloride 0.9 % bolus 1,000 mL (0 mLs Intravenous Stopped 06/04/18 1231)  lactated ringers bolus 1,000 mL (0 mLs Intravenous Stopped 06/04/18 1320)  insulin aspart (novoLOG) injection 5 Units (5 Units Intravenous Given 06/04/18 1101)  sodium bicarbonate injection 50 mEq (50 mEq Intravenous Given 06/04/18 1058)  dextrose 50 % solution 100 mL (100 mLs Intravenous Given 06/04/18 1054)  calcium gluconate 1 g/ 50 mL sodium chloride IVPB (0 g Intravenous Stopped 06/04/18 1144)  cefTRIAXone (ROCEPHIN) 1 g in sodium chloride 0.9 % 100 mL IVPB (0 g Intravenous Stopped 06/04/18 1215)  lidocaine (PF) (XYLOCAINE) 1 % injection 10 mL (10 mLs Intradermal Given by Other 06/04/18 1200)    Mobility non-ambulatory

## 2018-06-04 NOTE — ED Notes (Signed)
CRITICAL VALUE STICKER  CRITICAL VALUE: lactic 6.2, Potassium-7.0   RECEIVER (on-site recipient of call): [place] NOTIFIED: 1033 06/04/18   MD NOTIFIED: Kathrynn Humble MD  TIME OF NOTIFICATION: 0321 06/04/18  RESPONSE: See orders

## 2018-06-04 NOTE — H&P (Addendum)
History and Physical    CIRILO CANNER FBP:[id number] DOB: 02-25-1955 DOA: 06/04/2018  PCP: Verner Chol, MD Patient coming from: ALF  Chief Complaint: Vomiting  HPI: Chad Mathis is a 63 y.o. male with medical history significant of diabetes, bipolar disorder.  Patient reports nausea and vomiting for the last 3 days.  The symptoms have worsened.  He has not been able to tolerate a diet.  He has not taken anything to help with the symptoms.  No associated chest pain, shortness of breath, abdominal pain, diarrhea. He reports decreased urine output since Monday.  ED Course: Vitals: Afebrile, pulse of 102, RR of 21, normotensive with a BP of 124/64, SpO2 of 100% on room air Labs: Sodium 133, potassium 7.0, CO2 undetectable, BUN 88, AST/ALT 262/184 respectively, lactic acid 6.2, WBC 13.5k Imaging: None obtained Medications/Course: 1 L LR bolus, 1 L normal saline bolus, sodium bicarb drip, ceftriaxone IV, Lokelma, calcium gluconate  Review of Systems: Review of Systems  Constitutional: Negative for chills and fever.  Respiratory: Negative for cough and shortness of breath.   Cardiovascular: Negative for chest pain.  Gastrointestinal: Positive for nausea and vomiting. Negative for abdominal pain, constipation and diarrhea.  Neurological: Positive for weakness.  All other systems reviewed and are negative.   Past Medical History:  Diagnosis Date  . Asthma   . Bipolar 1 disorder ([place])   . Diabetes mellitus without complication [facility])     Past Surgical History:  Procedure Laterality Date  . HERNIA REPAIR       reports that he has never smoked. He has never used smokeless tobacco. He reports that he does not drink alcohol. No history on file for drug.  No Known Allergies  Family History  Problem Relation Age of Onset  . Renal Disease Neg Hx     Prior to Admission medications   Medication Sig Start Date End Date Taking? Authorizing Provider  aspirin 81 MG chewable  tablet Chew 81 mg by mouth daily.   Yes [provider]  divalproex (DEPAKOTE) 125 MG DR tablet Take 125 mg by mouth at bedtime. 05/06/18  Yes [provider]  ferrous sulfate 325 (65 FE) MG tablet Take 325 mg by mouth daily with breakfast.   Yes [provider]  fluticasone (FLONASE) 50 MCG/ACT nasal spray Place 1 spray into both nostrils daily.   Yes [provider]  gemfibrozil (LOPID) 600 MG tablet Take 600 mg by mouth 2 (two) times daily before a meal.  02/16/13  Yes [provider]  hydrochlorothiazide (HYDRODIURIL) 12.5 MG tablet Take 12.5 mg by mouth daily. 05/06/18  Yes [provider]  benazepril (LOTENSIN) 10 MG tablet  02/16/13   [provider]  divalproex (DEPAKOTE ER) 500 MG 24 hr tablet  02/17/13   [provider]  levothyroxine (SYNTHROID, LEVOTHROID) 75 MCG tablet  02/16/13   [provider]  metFORMIN (GLUCOPHAGE) 1000 MG tablet Take 1,000 mg by mouth 2 (two) times daily. 05/06/18   [provider]  metFORMIN (GLUCOPHAGE-XR) 500 MG 24 hr tablet  02/16/13   [provider]  oxybutynin (DITROPAN-XL) 10 MG 24 hr tablet  02/16/13   [provider]  simvastatin (ZOCOR) 40 MG tablet  02/16/13   [provider]  traZODone (DESYREL) 100 MG tablet  02/16/13   [provider]  ziprasidone (GEODON) 60 MG capsule  02/16/13   [provider]    Physical Exam:  Physical Exam Constitutional:  General: He is not in acute distress.    Appearance: He is well-developed. He is not diaphoretic.  Eyes:     Conjunctiva/sclera: Conjunctivae normal.     Pupils: Pupils are equal, round, and reactive to light.  Neck:     Musculoskeletal: Normal range of motion.  Cardiovascular:     Rate and Rhythm: Regular rhythm. Tachycardia present.     Pulses: Normal pulses.     Heart sounds: Murmur present. Systolic murmur present with a grade of 1/6.  Pulmonary:     Effort:  Pulmonary effort is normal. No respiratory distress.     Breath sounds: Normal breath sounds. No wheezing or rales.  Abdominal:     General: Bowel sounds are normal. There is distension (mild).     Palpations: Abdomen is soft.     Tenderness: There is no abdominal tenderness. There is no guarding or rebound.  Musculoskeletal: Normal range of motion.        General: No tenderness.     Right lower leg: No edema.     Left lower leg: No edema.  Lymphadenopathy:     Cervical: No cervical adenopathy.  Skin:    General: Skin is warm and dry.  Neurological:     Mental Status: He is alert and oriented to person, place, and time.      Labs on Admission: I have personally reviewed following labs and imaging studies  CBC: Recent Labs  Lab 06/04/18 0957  WBC 13.5*  NEUTROABS 12.0*  HGB 12.1*  HCT 39.4  MCV 81.9  PLT 009    Basic Metabolic Panel: Recent Labs  Lab 06/04/18 0957  NA 133*  K 7.0*  CL 97*  CO2 <7*  GLUCOSE 66*  BUN 88*  CREATININE 8.86*  CALCIUM 8.1*    GFR: CrCl cannot be calculated (Unknown ideal weight.).  Liver Function Tests: Recent Labs  Lab 06/04/18 0957  AST 262*  ALT 184*  ALKPHOS 48  BILITOT 0.7  PROT 7.3  ALBUMIN 3.8   Urine analysis:    Component Value Date/Time   COLORURINE YELLOW 12/13/2015 0322   APPEARANCEUR CLEAR 12/13/2015 0322   LABSPEC 1.018 12/13/2015 0322   PHURINE 5.0 12/13/2015 0322   GLUCOSEU NEGATIVE 12/13/2015 0322   HGBUR NEGATIVE 12/13/2015 0322   BILIRUBINUR NEGATIVE 12/13/2015 0322   KETONESUR NEGATIVE 12/13/2015 0322   PROTEINUR NEGATIVE 12/13/2015 0322   NITRITE NEGATIVE 12/13/2015 0322   LEUKOCYTESUR NEGATIVE 12/13/2015 0322     Radiological Exams on Admission: No results found.  EKG: Independently reviewed. Maybe mildly peaked T-waves. Sinus tachycardia  Assessment/Plan Principal Problem:   Acute renal failure (HCC) Active Problems:   Diabetes mellitus (HCC)   SIRS (systemic inflammatory  response syndrome) (HCC)  Acute kidney injury Metabolic acidosis Unknown etiology. Dialysis catheter to be placed by EDP. Discussed with nephrology who will arrange HD today. Severe metabolic acidosis. -Renal ultrasound -Nephrology recommendations: HD today, isotonic bicarb IV fluids -Renally dose all medications -Strict in and out with daily weights  SIRS Present on admission. Patient meets criteria with leukocytosis, tachypnea, tachycardia. Vacuolated neutrophils on differential with lymphopenia. Significantly elevated lactic acid. No source. Asymptomatic except for symptoms from ARF and possible uremia. Blood and urine cultures obtained. Patient is from a [facility] ALF with no known COVID-19 cases (confirmed with facility). Given 1 L NS bolus in the ED with 1L LR pending. -Zosyn empirically; will not start vancomycin secondary to above problem and low likelihood of MRSA infection -Blood/urine (if  able to produce urine sample) culture pending -SARS-Cov-2 test pending -Obtain chest x-ray  Nausea/vomiting Likely secondary to uremia. Has some mild abdominal distension with no tenderness, good bowel sounds and bowel function per patient report -Zofran prn  Weakness Secondary to AKI and hyperkalemia. -PT eval  Hyperkalemia Secondary to AKI.  S/p calcium gluconate and Lokelma -HD per nephrology  Diabetes mellitus, type 2 Patient is on metformin as an outpatient. No hemoglobin A1C on file -SSI q4 hours while vomiting and decreased PO intake -Hold metformin -Hemoglobin A1C  Essential hypertension Controlled. Last dose of hydrochlorothiazide today -Hold hydrochlorothiazide for now while inpatient  Bipolar disorder -Continue Depakote and Geodon  Elevated AST/ALT No known liver disease. No reported alcohol use. Patient is on statin and gemfibrozil use -Hepatitis panel -Hold simvastatin and may need to discontinue gemfibrozil on discharge   DVT prophylaxis: Heparin  subq Code Status: Full code Family Communication: Asked patient. He mentions a second cousin but asks me not to call Disposition Plan: Transfer to [facility] called: Nephrology Admission status: Inpatient   Cordelia Poche, MD [facility] 06/04/2018, 10:49 AM  If 7PM-7AM, please contact night-coverage [email or web address] Password [email or web address]

## 2018-06-04 NOTE — ED Notes (Signed)
Date and time results received: 06/04/18 @1030  (use smartphrase ".now" to insert current time)  Test:potassium 7.0 Critical Value: lactic acid 6.2  Name of Provider Notified:   Orders Received? Or Actions Taken?: pt's nurse [facility] notified

## 2018-06-04 NOTE — Progress Notes (Signed)
Pt admitted to [place] room 37, almost immediately went to renal ultra sound. Pt oriented x 4, discused call bell and unit procedures. Pt is legally blind. Skin check complete - pt with R IJ temporary hemodialysis catheter placed today in [facility] ED. Dressing is clean dry and intact.

## 2018-06-04 NOTE — ED Provider Notes (Addendum)
[place] DEPT Provider Note   CSN: [id number] Arrival date & time: 06/04/18  0915    History   Chief Complaint Chief Complaint  Patient presents with  . Hypoglycemia  . Weakness    HPI Chad Mathis is a 63 y.o. male.     HPI  63 year old male with history of asthma, diabetes and bipolar disorder comes in from nursing home with chief complaint of weakness and hypoglycemia.  Patient reports that he has been feeling weak for about 4 days now.  He also has no appetite and nausea with isolated episodes of emesis.  Patient denies any headache, neck pain, URI-like symptoms, cough, shortness of breath, chest pain, abdominal pain, back pain, UTI-like symptoms or rash.   His CBG was 60.  Juice provided.  Patient is also noted to have dark green stools.  He denies diarrhea and denies any history of melena.  Past Medical History:  Diagnosis Date  . Asthma   . Bipolar 1 disorder ([place])   . Diabetes mellitus without complication [facility])     Patient Active Problem List   Diagnosis Date Noted  . Acute renal failure ([place]) 06/04/2018  . SIRS (systemic inflammatory response syndrome) ([place]) 06/04/2018  . Onychomycosis 02/26/2013  . Pain in lower limb 02/26/2013  . Diabetes mellitus ([place]) 02/26/2013    Past Surgical History:  Procedure Laterality Date  . HERNIA REPAIR          Home Medications    Prior to Admission medications   Medication Sig Start Date End Date Taking? Authorizing Provider  aspirin 81 MG chewable tablet Chew 81 mg by mouth daily.   Yes [provider]  divalproex (DEPAKOTE) 125 MG DR tablet Take 125 mg by mouth at bedtime. 05/06/18  Yes [provider]  ferrous sulfate 325 (65 FE) MG tablet Take 325 mg by mouth daily with breakfast.   Yes [provider]  fluticasone (FLONASE) 50 MCG/ACT nasal spray Place 1 spray into both nostrils daily.   Yes [provider]  gemfibrozil (LOPID) 600 MG  tablet Take 600 mg by mouth 2 (two) times daily before a meal.  02/16/13  Yes [provider]  hydrochlorothiazide (HYDRODIURIL) 12.5 MG tablet Take 12.5 mg by mouth daily. 05/06/18  Yes [provider]  metFORMIN (GLUCOPHAGE) 1000 MG tablet Take 1,000 mg by mouth 2 (two) times daily. 05/06/18  Yes [provider]  simvastatin (ZOCOR) 40 MG tablet Take 40 mg by mouth daily at 6 PM.  02/16/13  Yes [provider]  traZODone (DESYREL) 150 MG tablet Take 150 mg by mouth at bedtime. 05/06/18  Yes [provider]  ziprasidone (GEODON) 60 MG capsule Take 60 mg by mouth daily.  02/16/13  Yes [provider]  traZODone (DESYREL) 100 MG tablet  02/16/13   [provider]    Family History Family History  Problem Relation Age of Onset  . Renal Disease Neg Hx     Social History Social History   Tobacco Use  . Smoking status: Never Smoker  . Smokeless tobacco: Never Used  Substance Use Topics  . Alcohol use: No  . Drug use: Not on file     Allergies   Patient has no known allergies.   Review of Systems Review of Systems  Constitutional: Positive for activity change and fatigue.  Respiratory: Negative for cough.   Cardiovascular: Negative for chest pain.  Gastrointestinal: Negative for nausea and vomiting.  Allergic/Immunologic: Negative for immunocompromised  state.  All other systems reviewed and are negative.    Physical Exam Updated Vital Signs BP (!) 145/78   Pulse (!) 117   Temp (!) 97.5 F (36.4 C) (Axillary)   Resp (!) 21   SpO2 99%   Physical Exam Vitals signs and nursing note reviewed.  Constitutional:      Appearance: He is well-developed.  HENT:     Head: Normocephalic and atraumatic.  Eyes:     Conjunctiva/sclera: Conjunctivae normal.     Pupils: Pupils are equal, round, and reactive to light.  Neck:     Musculoskeletal: Normal range of motion and neck supple.  Cardiovascular:     Rate and Rhythm:  Normal rate and regular rhythm.  Pulmonary:     Effort: Pulmonary effort is normal.     Breath sounds: Normal breath sounds.  Abdominal:     General: Bowel sounds are normal. There is no distension.     Palpations: Abdomen is soft. There is no mass.     Tenderness: There is no abdominal tenderness. There is no guarding or rebound.  Musculoskeletal:        General: No deformity.  Skin:    General: Skin is warm.  Neurological:     Mental Status: He is alert and oriented to person, place, and time.      ED Treatments / Results  Labs (all labs ordered are listed, but only abnormal results are displayed) Labs Reviewed  LACTIC ACID, PLASMA - Abnormal; Notable for the following components:      Result Value   Lactic Acid, Venous 6.2 (*)    All other components within normal limits  LACTIC ACID, PLASMA - Abnormal; Notable for the following components:   Lactic Acid, Venous 7.5 (*)    All other components within normal limits  COMPREHENSIVE METABOLIC PANEL - Abnormal; Notable for the following components:   Sodium 133 (*)    Potassium 7.0 (*)    Chloride 97 (*)    CO2 <7 (*)    Glucose, Bld 66 (*)    BUN 88 (*)    Creatinine, Ser 8.86 (*)    Calcium 8.1 (*)    AST 262 (*)    ALT 184 (*)    GFR calc non Af Amer 6 (*)    GFR calc Af Amer 7 (*)    All other components within normal limits  CBC WITH DIFFERENTIAL/PLATELET - Abnormal; Notable for the following components:   WBC 13.5 (*)    Hemoglobin 12.1 (*)    MCH 25.2 (*)    Neutro Abs 12.0 (*)    Lymphs Abs 0.6 (*)    All other components within normal limits  VALPROIC ACID LEVEL - Abnormal; Notable for the following components:   Valproic Acid Lvl <10 (*)    All other components within normal limits  BASIC METABOLIC PANEL - Abnormal; Notable for the following components:   Potassium 6.3 (*)    CO2 9 (*)    Glucose, Bld 206 (*)    BUN 84 (*)    Creatinine, Ser 8.35 (*)    Calcium 7.4 (*)    GFR calc non Af Amer 6 (*)     GFR calc Af Amer 7 (*)    Anion gap 28 (*)    All other components within normal limits  CBG MONITORING, ED - Abnormal; Notable for the following components:   Glucose-Capillary 67 (*)    All other components within normal limits  CBG MONITORING, ED - Abnormal; Notable for the following components:   Glucose-Capillary 275 (*)    All other components within normal limits  CBG MONITORING, ED - Abnormal; Notable for the following components:   Glucose-Capillary 238 (*)    All other components within normal limits  SARS CORONAVIRUS 2 (HOSPITAL ORDER, [place] [facility])  CULTURE, BLOOD (ROUTINE X 2)  CULTURE, BLOOD (ROUTINE X 2)  URINE CULTURE  PROTIME-INR  APTT  PROCALCITONIN  URINALYSIS, ROUTINE W REFLEX MICROSCOPIC  POC OCCULT BLOOD, ED    EKG EKG Interpretation  Date/Time:  Thursday Jun 04 2018 11:49:30 EDT Ventricular Rate:  124 PR Interval:    QRS Duration: 95 QT Interval:  311 QTC Calculation: 447 R Axis:   83 Text Interpretation:  Sinus tachycardia Borderline right axis deviation ST elevation, consider inferior injury since last tracing no significant change Confirmed by Daleen Bo [phone]) on 06/04/2018 3:25:10 PM   Radiology Dg Chest Port 1 View  Result Date: 06/04/2018 CLINICAL DATA:  Worsening nausea vomiting for 3 days EXAM: PORTABLE CHEST 1 VIEW COMPARISON:  None. FINDINGS: There is a right jugular central venous catheter with the tip projecting over the SVC. There is no focal parenchymal opacity. There is no pleural effusion or pneumothorax. The heart and mediastinal contours are unremarkable. There is gaseous distension of the stomach. The osseous structures are unremarkable. IMPRESSION: No active disease. Electronically Signed   By: Kathreen Devoid   On: 06/04/2018 12:47    Procedures .Critical Care Performed by: Varney Biles, MD Authorized by: Varney Biles, MD   Critical care provider statement:    Critical care time (minutes):   45   Critical care was necessary to treat or prevent imminent or life-threatening deterioration of the following conditions:  Metabolic crisis   Critical care was time spent personally by me on the following activities:  Discussions with consultants, evaluation of patient's response to treatment, examination of patient, ordering and performing treatments and interventions, ordering and review of laboratory studies, ordering and review of radiographic studies, pulse oximetry, re-evaluation of patient's condition, obtaining history from patient or surrogate and review of old charts .Central Line Date/Time: 06/04/2018 2:39 PM Performed by: Varney Biles, MD Authorized by: Varney Biles, MD   Consent:    Consent obtained:  Verbal   Consent given by:  Patient   Risks discussed:  Arterial puncture, incorrect placement, infection, pneumothorax and bleeding Pre-procedure details:    Hand hygiene: Hand hygiene performed prior to insertion     Sterile barrier technique: All elements of maximal sterile technique followed     Skin preparation:  2% chlorhexidine   Skin preparation agent: Skin preparation agent completely dried prior to procedure   Anesthesia (see MAR for exact dosages):    Anesthesia method:  None Procedure details:    Location:  R internal jugular   Patient position:  Trendelenburg   Procedural supplies:  Triple lumen (TRIALYSIS CATHETER)   Ultrasound guidance: yes     Number of attempts:  1   Successful placement: yes   Post-procedure details:    Post-procedure:  Dressing applied and line sutured   Assessment:  Blood return through all ports, no pneumothorax on x-ray, placement verified by x-ray and free fluid flow   Patient tolerance of procedure:  Tolerated well, no immediate complications   (including critical care time)  Medications Ordered in ED Medications  sodium zirconium cyclosilicate (LOKELMA) packet 10 g (10 g Oral Given 06/04/18 1101)  sodium bicarbonate  150  mEq in sterile water 1,000 mL infusion ( Intravenous New Bag/Given 06/04/18 1250)  lidocaine (XYLOCAINE) 1 % (with pres) injection (  See Procedure Record 06/04/18 1221)  aspirin chewable tablet 81 mg (has no administration in time range)  ziprasidone (GEODON) capsule 60 mg (has no administration in time range)  ferrous sulfate tablet 325 mg (has no administration in time range)  divalproex (DEPAKOTE) DR tablet 125 mg (has no administration in time range)  fluticasone (FLONASE) 50 MCG/ACT nasal spray 1 spray (has no administration in time range)  traZODone (DESYREL) tablet 150 mg (has no administration in time range)  heparin injection 5,000 Units (has no administration in time range)  acetaminophen (TYLENOL) tablet 650 mg (has no administration in time range)    Or  acetaminophen (TYLENOL) suppository 650 mg (has no administration in time range)  insulin aspart (novoLOG) injection 0-9 Units (has no administration in time range)  ondansetron (ZOFRAN) injection 4 mg (has no administration in time range)  piperacillin-tazobactam (ZOSYN) IVPB 3.375 g (0 g Intravenous Stopped 06/04/18 1432)    Followed by  piperacillin-tazobactam (ZOSYN) IVPB 2.25 g (has no administration in time range)  Chlorhexidine Gluconate Cloth 2 % PADS 6 each (has no administration in time range)  sodium chloride 0.9 % bolus 1,000 mL (0 mLs Intravenous Stopped 06/04/18 1231)  lactated ringers bolus 1,000 mL (0 mLs Intravenous Stopped 06/04/18 1320)  insulin aspart (novoLOG) injection 5 Units (5 Units Intravenous Given 06/04/18 1101)  sodium bicarbonate injection 50 mEq (50 mEq Intravenous Given 06/04/18 1058)  dextrose 50 % solution 100 mL (100 mLs Intravenous Given 06/04/18 1054)  calcium gluconate 1 g/ 50 mL sodium chloride IVPB (0 g Intravenous Stopped 06/04/18 1144)  cefTRIAXone (ROCEPHIN) 1 g in sodium chloride 0.9 % 100 mL IVPB (0 g Intravenous Stopped 06/04/18 1215)  lidocaine (PF) (XYLOCAINE) 1 % injection 10 mL (10 mLs  Intradermal Given by Other 06/04/18 1200)     Initial Impression / Assessment and Plan / ED Course  I have reviewed the triage vital signs and the nursing notes.  Pertinent labs & imaging results that were available during my care of the patient were reviewed by me and considered in my medical decision making (see chart for details).  Clinical Course as of Jun 03 1609  Thu Jun 04, 2018  1054 The patient is noted to have a lactate>4. With the current information available to me, I don't think the patient is in septic shock. The lactate>4, is related to renal failure.   Lactic Acid, Venous(!!): 6.2 [AN]  1054 Emergent hyperK meds ordered. Dr. Albertine Patricia requesting transfer to [facility].  Medicine to admit.  Ultrasound renal ordered to rule out obstructive uropathy as a cause for renal failure.  Potassium(!!): 7.0 [AN]    Clinical Course User Index [AN] Varney Biles, MD       63 year old comes in a chief complaint of weakness, reduced p.o. intake.  It appears that patient has no appetite.  History is not suggestive of any specific source of infection.  Patient denies any history of recurrent UTIs.  Differential diagnosis would include ACS, sepsis with UTI, AKI, electrolyte abnormalities, complications of dehydration.  Basic labs ordered.  Patient will be hydrated.   Chad Mathis was evaluated in Emergency Department on 06/04/2018 for the symptoms described in the history of present illness. He was evaluated in the context of the global COVID-19 pandemic, which necessitated consideration that the patient might be at risk for infection with the  SARS-CoV-2 virus that causes COVID-19. Institutional protocols and algorithms that pertain to the evaluation of patients at risk for COVID-19 are in a state of rapid change based on information released by regulatory bodies including the CDC and federal and state organizations. These policies and algorithms were followed during the patient's care in the  ED.   4:11 PM Hemodynamic reassessment.  Final Clinical Impressions(s) / ED Diagnoses   Final diagnoses:  Acute renal failure, unspecified acute renal failure type ([place])  Acute hyperkalemia  Lactic acidosis    ED Discharge Orders    None       Varney Biles, MD 06/04/18 1057    Varney Biles, MD 06/04/18 [place], [place]ola Mittman, MD 06/04/18 1611

## 2018-06-04 NOTE — Progress Notes (Signed)
Pharmacy Antibiotic Note  Chad Mathis is a 63 y.o. male admitted on 06/04/2018 with sepsis.  Pharmacy has been consulted for Zosyn dosing. Noted to have new AKI with hyperkalemia on admission. Plan is for HD later today.  Plan:  Zosyn 3.375g IV x1, then 2.25 g IV q8 hr  Prefer dosing after dialysis sessions if possible      Temp (24hrs), Avg:97.5 F (36.4 C), Min:97.5 F (36.4 C), Max:97.5 F (36.4 C)  Recent Labs  Lab 06/04/18 0957 06/04/18 1000 06/04/18 1142  WBC 13.5*  --   --   CREATININE 8.86*  --   --   LATICACIDVEN  --  6.2* 7.5*    CrCl cannot be calculated (Unknown ideal weight.).    No Known Allergies   Thank you for allowing pharmacy to be a part of this patient's care.  Reuel Boom, PharmD, BCPS [phone] 06/04/2018, 1:02 PM

## 2018-06-04 NOTE — Consult Note (Addendum)
[facility]  INPATIENT CONSULTATION  Reason for Consultation: AKI Requesting Provider:  Dr. Lonny Prude  HPI: Chad Mathis is an 63 y.o. male with DM, blindness, bipolar 1 DO who is seen for evaluation and management of AKI.   Presented to [facility] ED today with 3-4 day history of nausea, vomiting and progressive weakness. Continued to take meds including HCTZ, metformin, benazepril.    He lives in an ALF and staff noted him slumping against wall by report prompting EMS dispatch.  Hypotensive per EMS.    Labs with Cr 8.869, undetectable bicarb, K 7, lactate . ED rec'd 1L LR, 1L NS, bicarb gtt, IV calcium, IV insulin and dextrose, lokelma.  Has been normotensive, T 97.5, tachycardic 100-120, 100% RA.  Portable CXR normal.  COVID-19 neg.   Pt with Cr 1.47 in 2017.   PMH: Past Medical History:  Diagnosis Date  . Asthma   . Bipolar 1 disorder ([place])   . Diabetes mellitus without complication (HCC)    PSH: Past Surgical History:  Procedure Laterality Date  . HERNIA REPAIR      Past Medical History:  Diagnosis Date  . Asthma   . Bipolar 1 disorder ([place])   . Diabetes mellitus without complication ([place])     Medications:  I have reviewed the patient's current medications.  (Not in a hospital admission)   ALLERGIES:  No Known Allergies  FAM HX: Family History  Problem Relation Age of Onset  . Renal Disease Neg Hx     Social History:   reports that he has never smoked. He has never used smokeless tobacco. He reports that he does not drink alcohol. No history on file for drug.  ROS: 12 system ROS neg except per HPI above  Blood pressure (!) 145/78, pulse (!) 117, temperature (!) 97.5 F (36.4 C), temperature source Axillary, resp. rate (!) 21, SpO2 99 %. PHYSICAL EXAM: Gen: thin man on stretcher at 45 degrees in no distress  Eyes:  Anicteric, blind ENT: MM tacky  Neck: supple, no JVD, RIJ trialysis catheter with some oozing CV:  Tachycardic, regular, no rub, 1+  DPP pulses Lungs: sl tachypnea, no rales, wheezes Abd: soft, nontender to palpation GU: foley with scant amber urine Extr: no edema Neuro: grossly nonfocal except vision Skin: warm and dry   Results for orders placed or performed during the hospital encounter of 06/04/18 (from the past 48 hour(s))  POC CBG, ED     Status: Abnormal   Collection Time: 06/04/18  9:23 AM  Result Value Ref Range   Glucose-Capillary 67 (L) 70 - 99 mg/dL  Comprehensive metabolic panel     Status: Abnormal   Collection Time: 06/04/18  9:57 AM  Result Value Ref Range   Sodium 133 (L) 135 - 145 mmol/L   Potassium 7.0 (HH) 3.5 - 5.1 mmol/L    Comment: NO VISIBLE HEMOLYSIS CRITICAL RESULT CALLED TO, READ BACK BY AND VERIFIED WITH: KHALID,A. RN AT 1029 06/04/18 MULLINS,T    Chloride 97 (L) 98 - 111 mmol/L   CO2 <7 (L) 22 - 32 mmol/L   Glucose, Bld 66 (L) 70 - 99 mg/dL   BUN 88 (H) 8 - 23 mg/dL   Creatinine, Ser 8.86 (H) 0.61 - 1.24 mg/dL   Calcium 8.1 (L) 8.9 - 10.3 mg/dL   Total Protein 7.3 6.5 - 8.1 g/dL   Albumin 3.8 3.5 - 5.0 g/dL   AST 262 (H) 15 - 41 U/L   ALT 184 (H) 0 -  44 U/L   Alkaline Phosphatase 48 38 - 126 U/L   Total Bilirubin 0.7 0.3 - 1.2 mg/dL   GFR calc non Af Amer 6 (L) >60 mL/min   GFR calc Af Amer 7 (L) >60 mL/min   Anion gap NOT CALCULATED 5 - 15    Comment: Performed at [facility], [place] [address] [place], [place] [address]  CBC WITH DIFFERENTIAL     Status: Abnormal   Collection Time: 06/04/18  9:57 AM  Result Value Ref Range   WBC 13.5 (H) 4.0 - 10.5 K/uL    Comment: WHITE COUNT CONFIRMED ON SMEAR   RBC 4.81 4.22 - 5.81 MIL/uL   Hemoglobin 12.1 (L) 13.0 - 17.0 g/dL   HCT 39.4 39.0 - 52.0 %   MCV 81.9 80.0 - 100.0 fL   MCH 25.2 (L) 26.0 - 34.0 pg   MCHC 30.7 30.0 - 36.0 g/dL   RDW 14.1 11.5 - 15.5 %   Platelets 242 150 - 400 K/uL   nRBC 0.0 0.0 - 0.2 %   Neutrophils Relative % 88 %   Neutro Abs 12.0 (H) 1.7 - 7.7 K/uL   Lymphocytes Relative 4 %    Lymphs Abs 0.6 (L) 0.7 - 4.0 K/uL   Monocytes Relative 6 %   Monocytes Absolute 0.8 0.1 - 1.0 K/uL   Eosinophils Relative 1 %   Eosinophils Absolute 0.1 0.0 - 0.5 K/uL   Basophils Relative 0 %   Basophils Absolute 0.0 0.0 - 0.1 K/uL   WBC Morphology VACUOLATED NEUTROPHILS    Immature Granulocytes 1 %   Abs Immature Granulocytes 0.07 0.00 - 0.07 K/uL    Comment: Performed at [facility], [place] [address] [place], [place] [address]  Lactic acid, plasma     Status: Abnormal   Collection Time: 06/04/18 10:00 AM  Result Value Ref Range   Lactic Acid, Venous 6.2 (HH) 0.5 - 1.9 mmol/L    Comment: CRITICAL RESULT CALLED TO, READ BACK BY AND VERIFIED WITH: KHALID,A. RN AT 1029 06/04/18 MULLINS,T Performed at [facility], [place] [address] [place], [place] [address]   SARS Coronavirus 2 (CEPHEID - Performed in [place] hospital lab), Hosp Order     Status: None   Collection Time: 06/04/18 10:00 AM  Result Value Ref Range   SARS Coronavirus 2 NEGATIVE NEGATIVE    Comment: (NOTE) If result is NEGATIVE SARS-CoV-2 target nucleic acids are NOT DETECTED. The SARS-CoV-2 RNA is generally detectable in upper and lower  respiratory specimens during the acute phase of infection. The lowest  concentration of SARS-CoV-2 viral copies this assay can detect is 250  copies / mL. A negative result does not preclude SARS-CoV-2 infection  and should not be used as the sole basis for treatment or other  patient management decisions.  A negative result may occur with  improper specimen collection / handling, submission of specimen other  than nasopharyngeal swab, presence of viral mutation(s) within the  areas targeted by this assay, and inadequate number of viral copies  (<250 copies / mL). A negative result must be combined with clinical  observations, patient history, and epidemiological information. If result is POSITIVE SARS-CoV-2 target nucleic acids are  DETECTED. The SARS-CoV-2 RNA is generally detectable in upper and lower  respiratory specimens dur ing the acute phase of infection.  Positive  results are indicative of active infection with SARS-CoV-2.  Clinical  correlation with patient history and other diagnostic information is  necessary to determine patient infection status.  Positive results do  not rule out bacterial infection or co-infection with other viruses. If result is PRESUMPTIVE POSTIVE SARS-CoV-2 nucleic acids MAY BE PRESENT.   A presumptive positive result was obtained on the submitted specimen  and confirmed on repeat testing.  While 2019 novel coronavirus  (SARS-CoV-2) nucleic acids may be present in the submitted sample  additional confirmatory testing may be necessary for epidemiological  and / or clinical management purposes  to differentiate between  SARS-CoV-2 and other Sarbecovirus currently known to infect humans.  If clinically indicated additional testing with an alternate test  methodology [phone]) is advised. The SARS-CoV-2 RNA is generally  detectable in upper and lower respiratory sp ecimens during the acute  phase of infection. The expected result is Negative. Fact Sheet for Patients:  StrictlyIdeas.no Fact Sheet for Healthcare Providers: BankingDealers.co.za This test is not yet approved or cleared by the [place] FDA and has been authorized for detection and/or diagnosis of SARS-CoV-2 by FDA under an Emergency Use Authorization (EUA).  This EUA will remain in effect (meaning this test can be used) for the duration of the COVID-19 declaration under Section 564(b)(1) of the Act, 21 U.S.C. section 360bbb-3(b)(1), unless the authorization is terminated or revoked sooner. Performed at [facility], [place] [address] [place], [place] [address]   Protime-INR     Status: None   Collection Time: 06/04/18 10:44 AM  Result Value Ref  Range   Prothrombin Time 14.4 11.4 - 15.2 seconds   INR 1.1 0.8 - 1.2    Comment: (NOTE) INR goal varies based on device and disease states. Performed at [facility], [place] [address] [place], [place] [address]   APTT     Status: None   Collection Time: 06/04/18 10:44 AM  Result Value Ref Range   aPTT 29 24 - 36 seconds    Comment: Performed at [facility], [place] [address] [place], [place] [address]  POC CBG, ED     Status: Abnormal   Collection Time: 06/04/18 11:38 AM  Result Value Ref Range   Glucose-Capillary 275 (H) 70 - 99 mg/dL  Valproic acid level     Status: Abnormal   Collection Time: 06/04/18 11:41 AM  Result Value Ref Range   Valproic Acid Lvl <10 (L) 50.0 - 100.0 ug/mL    Comment: Performed at [facility], [place] [address], [place], [place] [address]  Lactic acid, plasma     Status: Abnormal   Collection Time: 06/04/18 11:42 AM  Result Value Ref Range   Lactic Acid, Venous 7.5 (HH) 0.5 - 1.9 mmol/L    Comment: CRITICAL RESULT CALLED TO, READ BACK BY AND VERIFIED WITH: KHALID,A. RN AT 1210 06/04/18 MULLINS,T Performed at [facility], [place] [address], [place],  [address]   CBG monitoring, ED     Status: Abnormal   Collection Time: 06/04/18 12:34 PM  Result Value Ref Range   Glucose-Capillary 238 (H) 70 - 99 mg/dL  POC occult blood, ED Provider will collect     Status: None   Collection Time: 06/04/18  3:10 PM  Result Value Ref Range   Fecal Occult Bld NEGATIVE NEGATIVE    Dg Chest Port 1 View  Result Date: 06/04/2018 CLINICAL DATA:  Worsening nausea vomiting for 3 days EXAM: PORTABLE CHEST 1 VIEW COMPARISON:  None. FINDINGS: There is a right jugular central venous catheter with the tip projecting over the SVC.  There is no focal parenchymal opacity. There is no pleural effusion or pneumothorax. The heart and mediastinal contours are unremarkable. There is gaseous distension of the  stomach. The osseous structures are unremarkable. IMPRESSION: No active disease. Electronically Signed   By: Kathreen Devoid   On: 06/04/2018 12:47    Assessment/Plan **AKI, severe, appears oliguric:  Presumably hypovolemia from N/V that led to hypotension.  In combination with ACEi I suspect he had an element of ATN.  Given the severity a dialysis catheter has been placed and will plan for 2.5hr dialysis treatment when he arrives at [facility] this afternoon (K2/Ca 3.5, no heparin, no UF).   Discussed with patient bedside and he's agreeable.  He still appears volume depleted, will given an additional 547mL bolus and continue bicarb gtt for now.  Hopefully with supportive care this will turn around quickly.   **hyperkalemia:  K from 7 to 6.3 with medical management.  Given ~anuric and part of management was shifting, expect some rebound.  Dialysis will help manage acutely.   **AGMA:  Lactate 9, presumably due to metformin + AKI.  R/o sepsis.  He's on bicarb drip currently at 125/hr which we will continue for now.  HD.  Hold metformin.   **SIRS criteria: s/p ceftriaxone, on zosyn renally dosed now.  Cultures per primary.  Exam unrevealing for potential source.  Possibly viral gastroenteritis + volume depletion.   **Nausea/emesis:  Suspect GI illness preceded AKI.  Exam fairly benign, suspect viral gastroenteritis.  Supportive care per primary.  **HTN:  Initially hypotensive, no normotensive.  Hold meds for now.   **Bipolar: on depakote and geodon.   Justin Mend 06/04/2018, 3:45 PM

## 2018-06-04 NOTE — ED Notes (Signed)
Bed: IW66 Expected date:  Expected time:  Means of arrival:  Comments: EMS-weak/hypotensive 71+ y/o M

## 2018-06-04 NOTE — ED Notes (Signed)
Patient still unable to urinate. Dr. Kathrynn Humble notified. Dr. Kathrynn Humble states there is no need for in and out catheter - patient's inability to urinate could be related to his dehydration and kidney failure.

## 2018-06-04 NOTE — ED Notes (Signed)
Patient aware that urine specimen needed. Condom catheter in place. Patient reports he cannot pee at this time.

## 2018-06-04 NOTE — ED Triage Notes (Signed)
Patient BIB EMS from [facility] home. Patient has history of bipolar disorder and is legally blind. Staff called EMS d/t patient reported weakness and decreased PO intake x2 days. Per report, patient vomited once yesterday, but denies any nausea, vomiting, diarrhea today. Patient denies SOB, cough, fever, CP. Patient AxOx4. Patient does report weakness today. Patient CBG for EMS was 61. EMS gave patient oral glucose, which patient only took half the tube. CBG reported to increase to 62.   PIV placed by EMS 20g Left AC.   Per report, no one in the facility has been diagnosed with Covid or covid-like symptoms. Staff reports checking residents temperatures regularly,

## 2018-06-04 NOTE — ED Notes (Signed)
Attempting to call report to [facility]. On hold waiting for nurse to nurse report.

## 2018-06-04 NOTE — ED Notes (Signed)
Call from lab, potassium is 6.3

## 2018-06-05 DIAGNOSIS — F319 Bipolar disorder, unspecified: Secondary | ICD-10-CM

## 2018-06-05 DIAGNOSIS — E875 Hyperkalemia: Secondary | ICD-10-CM

## 2018-06-05 DIAGNOSIS — N183 Chronic kidney disease, stage 3 (moderate): Secondary | ICD-10-CM

## 2018-06-05 DIAGNOSIS — E1122 Type 2 diabetes mellitus with diabetic chronic kidney disease: Secondary | ICD-10-CM

## 2018-06-05 LAB — COMPREHENSIVE METABOLIC PANEL
ALT: 157 U/L — ABNORMAL HIGH (ref 0–44)
AST: 199 U/L — ABNORMAL HIGH (ref 15–41)
Albumin: 2.7 g/dL — ABNORMAL LOW (ref 3.5–5.0)
Alkaline Phosphatase: 35 U/L — ABNORMAL LOW (ref 38–126)
Anion gap: 21 — ABNORMAL HIGH (ref 5–15)
BUN: 26 mg/dL — ABNORMAL HIGH (ref 8–23)
CO2: 26 mmol/L (ref 22–32)
Calcium: 8.7 mg/dL — ABNORMAL LOW (ref 8.9–10.3)
Chloride: 92 mmol/L — ABNORMAL LOW (ref 98–111)
Creatinine, Ser: 4.87 mg/dL — ABNORMAL HIGH (ref 0.61–1.24)
GFR calc Af Amer: 14 mL/min — ABNORMAL LOW (ref >60)
GFR calc non Af Amer: 12 mL/min — ABNORMAL LOW (ref >60)
Glucose, Bld: 106 mg/dL — ABNORMAL HIGH (ref 70–99)
Potassium: 3.2 mmol/L — ABNORMAL LOW (ref 3.5–5.1)
Sodium: 139 mmol/L (ref 135–145)
Total Bilirubin: 0.6 mg/dL (ref 0.3–1.2)
Total Protein: 5.4 g/dL — ABNORMAL LOW (ref 6.5–8.1)

## 2018-06-05 LAB — GLUCOSE, CAPILLARY
Glucose-Capillary: 73 mg/dL (ref 70–99)
Glucose-Capillary: 79 mg/dL (ref 70–99)
Glucose-Capillary: 102 mg/dL — ABNORMAL HIGH (ref 70–99)
Glucose-Capillary: 97 mg/dL (ref 70–99)
Glucose-Capillary: 120 mg/dL — ABNORMAL HIGH (ref 70–99)
Glucose-Capillary: 96 mg/dL (ref 70–99)
Glucose-Capillary: 121 mg/dL — ABNORMAL HIGH (ref 70–99)

## 2018-06-05 LAB — CBC
HCT: 30.1 % — ABNORMAL LOW (ref 39.0–52.0)
Hemoglobin: 10 g/dL — ABNORMAL LOW (ref 13.0–17.0)
MCH: 24.8 pg — ABNORMAL LOW (ref 26.0–34.0)
MCHC: 33.2 g/dL (ref 30.0–36.0)
MCV: 74.7 fL — ABNORMAL LOW (ref 80.0–100.0)
Platelets: 205 10*3/uL (ref 150–400)
RBC: 4.03 MIL/uL — ABNORMAL LOW (ref 4.22–5.81)
RDW: 13.3 % (ref 11.5–15.5)
WBC: 7 10*3/uL (ref 4.0–10.5)
nRBC: 0 % (ref 0.0–0.2)

## 2018-06-05 LAB — PROCALCITONIN: Procalcitonin: 1.25 ng/mL

## 2018-06-05 LAB — HIV ANTIBODY (ROUTINE TESTING W REFLEX): HIV Screen 4th Generation wRfx: NONREACTIVE

## 2018-06-05 LAB — HEPATITIS B SURFACE ANTIGEN: Hepatitis B Surface Ag: NEGATIVE

## 2018-06-05 LAB — HEPATITIS B SURFACE ANTIBODY,QUALITATIVE: Hep B S Ab: NONREACTIVE

## 2018-06-05 LAB — LACTIC ACID, PLASMA: Lactic Acid, Venous: 3 mmol/L (ref 0.5–1.9)

## 2018-06-05 LAB — VITAMIN B12: Vitamin B-12: 275 pg/mL (ref 180–914)

## 2018-06-05 LAB — IRON AND TIBC
Iron: 93 ug/dL (ref 45–182)
Saturation Ratios: 52 % — ABNORMAL HIGH (ref 17.9–39.5)
TIBC: 178 ug/dL — ABNORMAL LOW (ref 250–450)
UIBC: 85 ug/dL

## 2018-06-05 LAB — URIC ACID: Uric Acid, Serum: 4.4 mg/dL (ref 3.7–8.6)

## 2018-06-05 LAB — HEMOGLOBIN A1C
Hgb A1c MFr Bld: 5.9 % — ABNORMAL HIGH (ref 4.8–5.6)
Mean Plasma Glucose: 122.63 mg/dL

## 2018-06-05 LAB — HEPATITIS B CORE ANTIBODY, TOTAL: Hep B Core Total Ab: NEGATIVE

## 2018-06-05 LAB — CK: Total CK: 12583 U/L — ABNORMAL HIGH (ref 49–397)

## 2018-06-05 LAB — FERRITIN: Ferritin: 218 ng/mL (ref 24–336)

## 2018-06-05 MED ORDER — SODIUM CHLORIDE 0.9 % IV SOLN
INTRAVENOUS | Status: DC
  Administered 2018-06-05 – 2018-06-07 (×6): via INTRAVENOUS
  Administered 2018-06-08: 09:00:00 1000 mL via INTRAVENOUS
  Administered 2018-06-08: 04:00:00 via INTRAVENOUS

## 2018-06-05 NOTE — Evaluation (Signed)
Physical Therapy Evaluation Patient Details Name: Chad Mathis MRN: [id number] DOB: 05/27/1955 Today's Date: 06/05/2018   History of Present Illness  Pt is a 63 y.o. male admitted from ALF on 06/04/18 with nausea, vomiting and progressive weakness. Worked up for AKI and SIRS. PMH includes legally blind, bipolar disorder, DM2, asthma.  Clinical Impression  Patient required mod a for bed mobility and min a for all other transfers. At baseline he reports he was able to walk with his walker. If he has 24 hour assistance with mobility he should be able to go back to his adult home with assist. If they are not able to provide assist with transfers then he may require rehab at a SNF. Acute PT will continue to work with the patient.     Follow Up Recommendations SNF(or adult home ) 24 hour care     Equipment Recommendations  None     Recommendations for Other Services Rehab consult     Precautions / Restrictions Precautions Precautions: Fall Precaution Comments: legally blind  Restrictions Weight Bearing Restrictions: No      Mobility  Bed Mobility Overal bed mobility: Needs Assistance Bed Mobility: Sit to Supine       Sit to supine: Mod assist   General bed mobility comments: Mod a to scoot to the edge of the bed. Once sitting intemittent min a for balance   Transfers Overall transfer level: Needs assistance Equipment used: 2 person hand held assist Transfers: Sit to/from Stand Sit to Stand: Min assist         General transfer comment: Min a for strangth and balance to stand. Likely could have stood with walker.   Ambulation/Gait Ambulation/Gait assistance: Min assist;+2 physical assistance Gait Distance (Feet): 3 Feet Assistive device: 2 person hand held assist Gait Pattern/deviations: Step-through pattern Gait velocity: decreased   General Gait Details: able to take slow   Stairs            Wheelchair Mobility    Modified Rankin (Stroke Patients  Only)       Balance Overall balance assessment: Needs assistance Sitting-balance support: Bilateral upper extremity supported Sitting balance-Leahy Scale: Poor Sitting balance - Comments: had to assist patient with balance at times    Standing balance support: Bilateral upper extremity supported Standing balance-Leahy Scale: Poor Standing balance comment: min a for balance                              Pertinent Vitals/Pain Pain Assessment: Faces Faces Pain Scale: No hurt(reportedbeing uncomfortable in bed )    Home Living Family/patient expects to be discharged to:: Other (Comment)([facility] )                 Additional Comments: was at a skilled nursing facility and recieving rehab per patient.     Prior Function Level of Independence: Needs assistance   Gait / Transfers Assistance Needed: Patient reports he was using a walker to get around at his home   ADL's / Homemaking Assistance Needed: had assistance with ADL's         Hand Dominance        Extremity/Trunk Assessment   Upper Extremity Assessment Upper Extremity Assessment: Overall WFL for tasks assessed    Lower Extremity Assessment Lower Extremity Assessment: Generalized weakness    Cervical / Trunk Assessment Cervical / Trunk Assessment: Normal  Communication   Communication: No difficulties  Cognition Arousal/Alertness: Awake/alert  Behavior During Therapy: WFL for tasks assessed/performed Overall Cognitive Status: Within Functional Limits for tasks assessed                                 General Comments: Patient is baseline bipolar but was very pleasent during treatment      General Comments      Exercises     Assessment/Plan    PT Assessment Patient needs continued PT services  PT Problem List Decreased strength;Decreased range of motion;Decreased activity tolerance;Decreased mobility;Decreased coordination;Decreased balance       PT  Treatment Interventions Gait training;DME instruction;Stair training;Functional mobility training;Therapeutic activities;Therapeutic exercise;Neuromuscular re-education;Patient/family education    PT Goals (Current goals can be found in the Care Plan section)  Acute Rehab PT Goals Patient Stated Goal: to get out of bed   PT Goal Formulation: With patient Time For Goal Achievement: 06/12/18 Potential to Achieve Goals: Good    Frequency Min 2X/week   Barriers to discharge        Co-evaluation               AM-PAC PT "6 Clicks" Mobility  Outcome Measure Help needed turning from your back to your side while in a flat bed without using bedrails?: A Lot Help needed moving from lying on your back to sitting on the side of a flat bed without using bedrails?: A Lot Help needed moving to and from a bed to a chair (including a wheelchair)?: A Little Help needed standing up from a chair using your arms (e.g., wheelchair or bedside chair)?: A Little Help needed to walk in hospital room?: A Lot Help needed climbing 3-5 steps with a railing? : Total 6 Click Score: 13    End of Session Equipment Utilized During Treatment: Gait belt Activity Tolerance: Patient tolerated treatment well Patient left: in chair;with call bell/phone within reach;with chair alarm set(call button in hand, showed where to press \) Nurse Communication: Mobility status PT Visit Diagnosis: Unsteadiness on feet (R26.81);Muscle weakness (generalized) (M62.81)    Time: 1829-9371 PT Time Calculation (min) (ACUTE ONLY): 20 min   Charges:   PT Evaluation $PT Eval Moderate Complexity: 1 Mod           Carney Living PT DPT  06/05/2018, 5:13 PM

## 2018-06-05 NOTE — Progress Notes (Addendum)
PROGRESS NOTE    Chad Mathis  ZMO:[id number] DOB: [birth year] DOA: 06/04/2018 PCP: Verner Chol, MD     Brief Narrative:  63 y.o. male with medical history significant of diabetes, bipolar disorder.  Patient reports nausea and vomiting for the last 3 days.  The symptoms have worsened.  He has not been able to tolerate a diet.  He has not taken anything to help with the symptoms.  No associated chest pain, shortness of breath, abdominal pain, diarrhea. He reports decreased urine output since Monday.   Assessment & Plan: Acute on chronic renal failure: Stage III at baseline secondary to diabetes most likely -Patient currently oliguric -Received emergent hemodialysis on 06/04/2018 -Creatinine down to 4.3 -Metabolic acidosis resolved -Electrolytes stable currently -Continue IV fluids -Follow renal function trend and recovery in his ability to make urine -Follow nephrology recommendations. -Continue avoiding nephrotoxic agents.  SIRS -Currently afebrile -COVID-19 -Follow culture results -Continue empirical use of Zosyn; if no source of infection appreciated in the next 24 hours we will discontinue antibiotics and follow patient's stability -Very possible that his service is secondary to dehydration, acute renal failure and uremia.  Nausea/vomiting -Secondary to uremia versus underlying viral gastroenteritis -Continue as needed antiemetics -Continue IV fluids -Advance diet as tolerated.  Hyperkalemia -In the setting of acute renal failure -Improved and resolved after hemodialysis -Patient also receive bicarbonate, Lokelma and calcium gluconate at time of admission.  Type II diabetes mellitus with nephropathy -Continue sliding scale insulin -Follow CBGs and adjust hypoglycemic regimen as needed -Continue holding oral hypoglycemic agents.  Bipolar disorder/schizophrenia -Continue Depakote and Geodon -No suicidal ideation or hallucinations currently.  Elevated  transaminitis -No prior history of liver disease -Continue holding statins -Follow LFTs trend -Most likely associated with shock liver.  Essential HTN -BP stable currently (borderline low) -continue holding antihypertensive agents)  DVT prophylaxis: Heparin Code Status: Full code Family Communication: No family at bedside Disposition Plan: Continue IV fluids, follow renal function and electrolytes trend, follow urine output, follow nephrology recommendations.  Consultants:   Nephrology service  Procedures:   See below for x-ray reports.  Antimicrobials:  Anti-infectives (From admission, onward)   Start     Dose/Rate Route Frequency Ordered Stop   06/04/18 2200  piperacillin-tazobactam (ZOSYN) IVPB 2.25 g     2.25 g 100 mL/hr over 30 Minutes Intravenous Every 8 hours 06/04/18 1250     06/04/18 1300  piperacillin-tazobactam (ZOSYN) IVPB 3.375 g     3.375 g 100 mL/hr over 30 Minutes Intravenous  Once 06/04/18 1250 06/04/18 1432   06/04/18 1100  cefTRIAXone (ROCEPHIN) 1 g in sodium chloride 0.9 % 100 mL IVPB     1 g 200 mL/hr over 30 Minutes Intravenous  Once 06/04/18 1054 06/04/18 1215      Subjective: Afebrile, no chest pain, no nausea, no vomiting, no shortness of breath.  Patient mentation is still not back to baseline.  Able to follow simple commands and answering questions appropriately.  Objective: Vitals:   06/05/18 0034 06/05/18 0318 06/05/18 0805 06/05/18 0835  BP: 120/73 114/68 102/76 100/63  Pulse: 95     Resp: 19 18 16 16   Temp: 97.8 F (36.6 C) 97.7 F (36.5 C) 97.7 F (36.5 C) 97.7 F (36.5 C)  TempSrc: Axillary Oral Oral Oral  SpO2: 98%     Weight:        Intake/Output Summary (Last 24 hours) at 06/05/2018 1820 Last data filed at 06/05/2018 1600 Gross per 24 hour  Intake 1194 ml  Output 0 ml  Net 1194 ml   Filed Weights   06/04/18 1715 06/04/18 2006 06/04/18 2056  Weight: 56.4 kg 56.7 kg 56.7 kg    Examination: General exam: Alert,  awake, oriented x 2; denies chest pain or shortness of breath currently.  Still confused, even he is able to follow simple commands and answer simple questions.  No nausea, no vomiting. Respiratory system: Clear to auscultation. Respiratory effort normal. Cardiovascular system:RRR. No murmurs, rubs, gallops. Gastrointestinal system: Abdomen is nondistended, soft and nontender. No organomegaly or masses felt. Normal bowel sounds heard. Central nervous system: Alert and oriented. No focal neurological deficits. Extremities: No C/C/E, +pedal pulses Skin: No rashes, lesions or ulcers Psychiatry: Judgement and insight appear impaired secondary to uremic encephalopathy most likely.    Data Reviewed: I have personally reviewed following labs and imaging studies  CBC: Recent Labs  Lab 06/04/18 0957 06/05/18 0715  WBC 13.5* 7.0  NEUTROABS 12.0*  --   HGB 12.1* 10.0*  HCT 39.4 30.1*  MCV 81.9 74.7*  PLT 242 694   Basic Metabolic Panel: Recent Labs  Lab 06/04/18 0957 06/04/18 1504 06/05/18 0715  NA 133* 136 139  K 7.0* 6.3* 3.2*  CL 97* 99 92*  CO2 <7* 9* 26  GLUCOSE 66* 206* 106*  BUN 88* 84* 26*  CREATININE 8.86* 8.35* 4.87*  CALCIUM 8.1* 7.4* 8.7*   GFR: CrCl cannot be calculated (Unknown ideal weight.).   Liver Function Tests: Recent Labs  Lab 06/04/18 0957 06/05/18 0715  AST 262* 199*  ALT 184* 157*  ALKPHOS 48 35*  BILITOT 0.7 0.6  PROT 7.3 5.4*  ALBUMIN 3.8 2.7*   Coagulation Profile: Recent Labs  Lab 06/04/18 1044  INR 1.1   Cardiac Enzymes: Recent Labs  Lab 06/05/18 1207  CKTOTAL 12,583*   HbA1C: Recent Labs    06/05/18 0715  HGBA1C 5.9*   CBG: Recent Labs  Lab 06/05/18 0329 06/05/18 0542 06/05/18 0805 06/05/18 1245 06/05/18 1608  GLUCAP 79 102* 97 120* 96   Anemia Panel: Recent Labs    06/05/18 1207  VITAMINB12 275  FERRITIN 218  TIBC 178*  IRON 93   Urine analysis:    Component Value Date/Time   COLORURINE YELLOW 12/13/2015  0322   APPEARANCEUR CLEAR 12/13/2015 0322   LABSPEC 1.018 12/13/2015 0322   PHURINE 5.0 12/13/2015 0322   GLUCOSEU NEGATIVE 12/13/2015 0322   HGBUR NEGATIVE 12/13/2015 0322   BILIRUBINUR NEGATIVE 12/13/2015 0322   KETONESUR NEGATIVE 12/13/2015 0322   PROTEINUR NEGATIVE 12/13/2015 0322   NITRITE NEGATIVE 12/13/2015 0322   LEUKOCYTESUR NEGATIVE 12/13/2015 0322    Recent Results (from the past 240 hour(s))  Blood Culture (routine x 2)     Status: None (Preliminary result)   Collection Time: 06/04/18  9:57 AM  Result Value Ref Range Status   Specimen Description   Final    BLOOD RIGHT HAND Performed at [facility], [place] [address], [place], [place] [address]    Special Requests   Final    BOTTLES DRAWN AEROBIC ONLY Blood Culture adequate volume Performed at [place] [address] [place], [place] [address]    Culture   Final    NO GROWTH 1 DAY Performed at [facility], [place] [address], [place],  [address]    Report Status PENDING  Incomplete  Blood Culture (routine x 2)     Status: None (Preliminary result)   Collection Time: 06/04/18 10:00 AM  Result  Value Ref Range Status   Specimen Description   Final    BLOOD LEFT HAND Performed at [facility] [place] [address], [place], [place] [address]    Special Requests   Final    BOTTLES DRAWN AEROBIC AND ANAEROBIC Blood Culture results may not be optimal due to an inadequate volume of blood received in culture bottles Performed at [facility] [address], [place], [place] [address]    Culture   Final    NO GROWTH 1 DAY Performed at [facility], [place] [address] [place], [place] [address]    Report Status PENDING  Incomplete  SARS Coronavirus 2 (CEPHEID - Performed in [place] hospital lab), Hosp Order     Status: None   Collection Time: 06/04/18 10:00 AM  Result Value Ref Range Status   SARS Coronavirus 2 NEGATIVE  NEGATIVE Final    Comment: (NOTE) If result is NEGATIVE SARS-CoV-2 target nucleic acids are NOT DETECTED. The SARS-CoV-2 RNA is generally detectable in upper and lower  respiratory specimens during the acute phase of infection. The lowest  concentration of SARS-CoV-2 viral copies this assay can detect is 250  copies / mL. A negative result does not preclude SARS-CoV-2 infection  and should not be used as the sole basis for treatment or other  patient management decisions.  A negative result may occur with  improper specimen collection / handling, submission of specimen other  than nasopharyngeal swab, presence of viral mutation(s) within the  areas targeted by this assay, and inadequate number of viral copies  (<250 copies / mL). A negative result must be combined with clinical  observations, patient history, and epidemiological information. If result is POSITIVE SARS-CoV-2 target nucleic acids are DETECTED. The SARS-CoV-2 RNA is generally detectable in upper and lower  respiratory specimens dur ing the acute phase of infection.  Positive  results are indicative of active infection with SARS-CoV-2.  Clinical  correlation with patient history and other diagnostic information is  necessary to determine patient infection status.  Positive results do  not rule out bacterial infection or co-infection with other viruses. If result is PRESUMPTIVE POSTIVE SARS-CoV-2 nucleic acids MAY BE PRESENT.   A presumptive positive result was obtained on the submitted specimen  and confirmed on repeat testing.  While 2019 novel coronavirus  (SARS-CoV-2) nucleic acids may be present in the submitted sample  additional confirmatory testing may be necessary for epidemiological  and / or clinical management purposes  to differentiate between  SARS-CoV-2 and other Sarbecovirus currently known to infect humans.  If clinically indicated additional testing with an alternate test  methodology [phone]) is  advised. The SARS-CoV-2 RNA is generally  detectable in upper and lower respiratory sp ecimens during the acute  phase of infection. The expected result is Negative. Fact Sheet for Patients:  StrictlyIdeas.no Fact Sheet for Healthcare Providers: BankingDealers.co.za This test is not yet approved or cleared by the [place] FDA and has been authorized for detection and/or diagnosis of SARS-CoV-2 by FDA under an Emergency Use Authorization (EUA).  This EUA will remain in effect (meaning this test can be used) for the duration of the COVID-19 declaration under Section 564(b)(1) of the Act, 21 U.S.C. section 360bbb-3(b)(1), unless the authorization is terminated or revoked sooner. Performed at [facility], [place] [address], [place], [place] [address]      Radiology Studies: US Renal  Result Date: 06/04/2018 CLINICAL DATA:  Weakness, hyperglycemic EXAM: RENAL / URINARY  TRACT ULTRASOUND COMPLETE COMPARISON:  None. FINDINGS: Right Kidney: Renal measurements: 11.7 x 5.8 x 6 cm = volume: 211 mL. Parenchyma is hyperechoic to the adjacent liver. No mass or hydronephrosis visualized. Left Kidney: Renal measurements: 10.9 x 5.9 x 5.6 cm = volume: 186 mL. Echogenic parenchyma. No hydronephrosis. 1 x 0.8 cm cyst in the upper pole. Bladder: Appears normal for degree of bladder distention. IMPRESSION: 1. Echogenic renal parenchyma, a nonspecific indicator of medical renal disease. 2. No hydronephrosis. Electronically Signed   By: Lucrezia Europe M.D.   On: 06/04/2018 16:52   Dg Chest Port 1 View  Result Date: 06/04/2018 CLINICAL DATA:  Worsening nausea vomiting for 3 days EXAM: PORTABLE CHEST 1 VIEW COMPARISON:  None. FINDINGS: There is a right jugular central venous catheter with the tip projecting over the SVC. There is no focal parenchymal opacity. There is no pleural effusion or pneumothorax. The heart and mediastinal contours are  unremarkable. There is gaseous distension of the stomach. The osseous structures are unremarkable. IMPRESSION: No active disease. Electronically Signed   By: Kathreen Devoid   On: 06/04/2018 12:47    Scheduled Meds: . aspirin  81 mg Oral Daily  . Chlorhexidine Gluconate Cloth  6 each Topical Q0600  . divalproex  125 mg Oral QHS  . ferrous sulfate  325 mg Oral Q breakfast  . fluticasone  1 spray Each Nare Daily  . heparin  5,000 Units Subcutaneous Q8H  . insulin aspart  0-9 Units Subcutaneous Q4H  . traZODone  150 mg Oral QHS  . ziprasidone  60 mg Oral Daily   Continuous Infusions: . sodium chloride 150 mL/hr at 06/05/18 1806  . piperacillin-tazobactam (ZOSYN)  IV 2.25 g (06/05/18 1515)  . sodium chloride       LOS: 1 day    Time spent: 35 minutes.  More than 50% of the time dedicated to face-to-face examination, discussion about acute illness and planning/coordinating care.  Discussed with nephrologist (appreciate assistance and recommendations).  Patient has been informed of the need of acute hemodialysis treatment due to renal failure, oliguria and severe hyperkalemia.  Despite good tolerance on dialysis on 06/04/2018, patient has remained oliguric.  More alert today, but is still pleasantly confused.   Barton Dubois, MD [facility] Pager [phone]  06/05/2018, 6:20 PM

## 2018-06-05 NOTE — Progress Notes (Addendum)
Patient ID: Chad Mathis, male   DOB: May 29, 1955, 63 y.o.   MRN: [id number] S:Pt tolerated HD last night, somnolent and confused this morning.  Doesn't remember dialysis yesterday. O:BP 100/63   Pulse 95   Temp 97.7 F (36.5 C) (Oral)   Resp 16   Wt 56.7 kg   SpO2 98%   BMI 22.14 kg/m   Intake/Output Summary (Last 24 hours) at 06/05/2018 1129 Last data filed at 06/05/2018 0900 Gross per 24 hour  Intake 544 ml  Output 0 ml  Net 544 ml   Intake/Output: I/O last 3 completed shifts: In: 300 [P.O.:300] Out: 0   Intake/Output this shift:  Total I/O In: 40 [P.O.:244] Out: -  Weight change:  HEN:IDPOE, elderly AAM who appears older than stated age. CVS: no rub Resp: cta Abd: benign Ext: no edema  Recent Labs  Lab 06/04/18 0957 06/04/18 1504 06/05/18 0715  NA 133* 136 139  K 7.0* 6.3* 3.2*  CL 97* 99 92*  CO2 <7* 9* 26  GLUCOSE 66* 206* 106*  BUN 88* 84* 26*  CREATININE 8.86* 8.35* 4.87*  ALBUMIN 3.8  --  2.7*  CALCIUM 8.1* 7.4* 8.7*  AST 262*  --  199*  ALT 184*  --  157*   Liver Function Tests: Recent Labs  Lab 06/04/18 0957 06/05/18 0715  AST 262* 199*  ALT 184* 157*  ALKPHOS 48 35*  BILITOT 0.7 0.6  PROT 7.3 5.4*  ALBUMIN 3.8 2.7*   No results for input(s): LIPASE, AMYLASE in the last 168 hours. No results for input(s): AMMONIA in the last 168 hours. CBC: Recent Labs  Lab 06/04/18 0957 06/05/18 0715  WBC 13.5* 7.0  NEUTROABS 12.0*  --   HGB 12.1* 10.0*  HCT 39.4 30.1*  MCV 81.9 74.7*  PLT 242 205   Cardiac Enzymes: No results for input(s): CKTOTAL, CKMB, CKMBINDEX, TROPONINI in the last 168 hours. CBG: Recent Labs  Lab 06/04/18 2347 06/05/18 0124 06/05/18 0329 06/05/18 0542 06/05/18 0805  GLUCAP 75 73 79 102* 97    Iron Studies: No results for input(s): IRON, TIBC, TRANSFERRIN, FERRITIN in the last 72 hours. Studies/Results: US Renal  Result Date: 06/04/2018 CLINICAL DATA:  Weakness, hyperglycemic EXAM: RENAL / URINARY TRACT  ULTRASOUND COMPLETE COMPARISON:  None. FINDINGS: Right Kidney: Renal measurements: 11.7 x 5.8 x 6 cm = volume: 211 mL. Parenchyma is hyperechoic to the adjacent liver. No mass or hydronephrosis visualized. Left Kidney: Renal measurements: 10.9 x 5.9 x 5.6 cm = volume: 186 mL. Echogenic parenchyma. No hydronephrosis. 1 x 0.8 cm cyst in the upper pole. Bladder: Appears normal for degree of bladder distention. IMPRESSION: 1. Echogenic renal parenchyma, a nonspecific indicator of medical renal disease. 2. No hydronephrosis. Electronically Signed   By: Lucrezia Europe M.D.   On: 06/04/2018 16:52   Dg Chest Port 1 View  Result Date: 06/04/2018 CLINICAL DATA:  Worsening nausea vomiting for 3 days EXAM: PORTABLE CHEST 1 VIEW COMPARISON:  None. FINDINGS: There is a right jugular central venous catheter with the tip projecting over the SVC. There is no focal parenchymal opacity. There is no pleural effusion or pneumothorax. The heart and mediastinal contours are unremarkable. There is gaseous distension of the stomach. The osseous structures are unremarkable. IMPRESSION: No active disease. Electronically Signed   By: Kathreen Devoid   On: 06/04/2018 12:47   . aspirin  81 mg Oral Daily  . Chlorhexidine Gluconate Cloth  6 each Topical Q0600  . divalproex  125 mg  Oral QHS  . ferrous sulfate  325 mg Oral Q breakfast  . fluticasone  1 spray Each Nare Daily  . heparin  5,000 Units Subcutaneous Q8H  . insulin aspart  0-9 Units Subcutaneous Q4H  . traZODone  150 mg Oral QHS  . ziprasidone  60 mg Oral Daily    BMET    Component Value Date/Time   NA 139 06/05/2018 0715   K 3.2 (L) 06/05/2018 0715   CL 92 (L) 06/05/2018 0715   CO2 26 06/05/2018 0715   GLUCOSE 106 (H) 06/05/2018 0715   BUN 26 (H) 06/05/2018 0715   CREATININE 4.87 (H) 06/05/2018 0715   CALCIUM 8.7 (L) 06/05/2018 0715   GFRNONAA 12 (L) 06/05/2018 0715   GFRAA 14 (L) 06/05/2018 0715   CBC    Component Value Date/Time   WBC 7.0 06/05/2018 0715    RBC 4.03 (L) 06/05/2018 0715   HGB 10.0 (L) 06/05/2018 0715   HCT 30.1 (L) 06/05/2018 0715   PLT 205 06/05/2018 0715   MCV 74.7 (L) 06/05/2018 0715   MCH 24.8 (L) 06/05/2018 0715   MCHC 33.2 06/05/2018 0715   RDW 13.3 06/05/2018 0715   LYMPHSABS 0.6 (L) 06/04/2018 0957   MONOABS 0.8 06/04/2018 0957   EOSABS 0.1 06/04/2018 0957   BASOSABS 0.0 06/04/2018 0957     Assessment/Plan:  1. AKI/CKD stage 3 (last known Cr was 1.42 in Dec 2017). Oliguric presumably multifactorial; volume depletion due to N/V in setting of concomitant ACE inhibition and diuretics.  Required urgent HD on 06/04/18 due to hyperkalemia.  Receiving IVF's but remains oliguric.  [place] without hydro but did show increased echogenicity consistent with chronic medical renal disease. 1. Check bladder scan now that he has received IVF's overnight and if >300 place foley 2. Continue to hold benazepril, hctz, and glucophage 3. Avoid nephrotoxic agents 4. Switch ivfs to NS now that metabolic acidosis has resolved. 2. Metabolic acidosis- due to #1 and concomitant metformin.  Improved with isotonic saline 3. Rhabdomyolysis- likely contributing to AKI but unclear etiology other than statin therapy.  No history of falls or being found down.  Increase IVF's and follow levels. 4. Hyperkalemia- improved with HD, lokelma, and IV bicarb 1. Now hypokalemic.  Hold off repletion and recheck later today. 5. Anemia- presumably due to CKD, however unknown baseline Cr.  Will check SPEP/UPEP, iron stores and follow.  Would also recommend guaiac stools 6. SIRS- on zosyn and cultures pending. covid-19 negative 7. N/V- unclear if this is source of AKI or due to renal failure.  None since admission.  8. DM- per primary svc 9. HTN- low BP's on arrival, meds on hold 10. Elevated AST/ALT- likely due to shock liver but r/o infectious and agree with holding statin.  Donetta Potts, MD [employer] [phone]

## 2018-06-06 LAB — URINALYSIS, COMPLETE (UACMP) WITH MICROSCOPIC
Bilirubin Urine: NEGATIVE
Glucose, UA: 50 mg/dL — AB
Ketones, ur: NEGATIVE mg/dL
Leukocytes,Ua: NEGATIVE
Nitrite: NEGATIVE
Protein, ur: 100 mg/dL — AB
Specific Gravity, Urine: 1.02 (ref 1.005–1.030)
pH: 5 (ref 5.0–8.0)

## 2018-06-06 LAB — GLUCOSE, CAPILLARY
Glucose-Capillary: 86 mg/dL (ref 70–99)
Glucose-Capillary: 84 mg/dL (ref 70–99)
Glucose-Capillary: 84 mg/dL (ref 70–99)
Glucose-Capillary: 85 mg/dL (ref 70–99)
Glucose-Capillary: 128 mg/dL — ABNORMAL HIGH (ref 70–99)

## 2018-06-06 LAB — PROTEIN / CREATININE RATIO, URINE
Creatinine, Urine: 157.1 mg/dL
Protein Creatinine Ratio: 2.03 mg/mg{Cre} — ABNORMAL HIGH (ref 0.00–0.15)
Total Protein, Urine: 319 mg/dL

## 2018-06-06 LAB — CBC
HCT: 28.5 % — ABNORMAL LOW (ref 39.0–52.0)
Hemoglobin: 9.8 g/dL — ABNORMAL LOW (ref 13.0–17.0)
MCH: 25.9 pg — ABNORMAL LOW (ref 26.0–34.0)
MCHC: 34.4 g/dL (ref 30.0–36.0)
MCV: 75.4 fL — ABNORMAL LOW (ref 80.0–100.0)
Platelets: 160 10*3/uL (ref 150–400)
RBC: 3.78 MIL/uL — ABNORMAL LOW (ref 4.22–5.81)
RDW: 13.2 % (ref 11.5–15.5)
WBC: 6.7 10*3/uL (ref 4.0–10.5)
nRBC: 0 % (ref 0.0–0.2)

## 2018-06-06 LAB — COMPREHENSIVE METABOLIC PANEL
ALT: 162 U/L — ABNORMAL HIGH (ref 0–44)
AST: 213 U/L — ABNORMAL HIGH (ref 15–41)
Albumin: 2.5 g/dL — ABNORMAL LOW (ref 3.5–5.0)
Alkaline Phosphatase: 31 U/L — ABNORMAL LOW (ref 38–126)
Anion gap: 17 — ABNORMAL HIGH (ref 5–15)
BUN: 30 mg/dL — ABNORMAL HIGH (ref 8–23)
CO2: 24 mmol/L (ref 22–32)
Calcium: 8 mg/dL — ABNORMAL LOW (ref 8.9–10.3)
Chloride: 98 mmol/L (ref 98–111)
Creatinine, Ser: 5.73 mg/dL — ABNORMAL HIGH (ref 0.61–1.24)
GFR calc Af Amer: 11 mL/min — ABNORMAL LOW (ref >60)
GFR calc non Af Amer: 10 mL/min — ABNORMAL LOW (ref >60)
Glucose, Bld: 105 mg/dL — ABNORMAL HIGH (ref 70–99)
Potassium: 2.9 mmol/L — ABNORMAL LOW (ref 3.5–5.1)
Sodium: 139 mmol/L (ref 135–145)
Total Bilirubin: 0.7 mg/dL (ref 0.3–1.2)
Total Protein: 4.9 g/dL — ABNORMAL LOW (ref 6.5–8.1)

## 2018-06-06 LAB — CREATININE, URINE, RANDOM: Creatinine, Urine: 160.22 mg/dL

## 2018-06-06 LAB — OCCULT BLOOD X 1 CARD TO LAB, STOOL: Fecal Occult Bld: POSITIVE — AB

## 2018-06-06 LAB — PROCALCITONIN: Procalcitonin: 1.2 ng/mL

## 2018-06-06 LAB — CK: Total CK: 6870 U/L — ABNORMAL HIGH (ref 49–397)

## 2018-06-06 LAB — SODIUM, URINE, RANDOM: Sodium, Ur: 31 mmol/L

## 2018-06-06 LAB — MRSA PCR SCREENING: MRSA by PCR: NEGATIVE

## 2018-06-06 MED ORDER — POTASSIUM CHLORIDE CRYS ER 20 MEQ PO TBCR
20.0000 meq | EXTENDED_RELEASE_TABLET | Freq: Every day | ORAL | Status: AC
  Administered 2018-06-06 – 2018-06-08 (×3): 20 meq via ORAL
  Filled 2018-06-06 (×3): qty 1

## 2018-06-06 MED ORDER — POTASSIUM CHLORIDE CRYS ER 20 MEQ PO TBCR
20.0000 meq | EXTENDED_RELEASE_TABLET | Freq: Once | ORAL | Status: AC
  Administered 2018-06-06: 20 meq via ORAL
  Filled 2018-06-06: qty 1

## 2018-06-06 NOTE — Progress Notes (Signed)
PROGRESS NOTE    Chad Mathis  NLZ:[id number] DOB: Jul 10, 1955 DOA: 06/04/2018 PCP: Verner Chol, MD     Brief Narrative:  63 y.o. male with medical history significant of diabetes, bipolar disorder.  Patient reports nausea and vomiting for the last 3 days.  The symptoms have worsened.  He has not been able to tolerate a diet.  He has not taken anything to help with the symptoms.  No associated chest pain, shortness of breath, abdominal pain, diarrhea. He reports decreased urine output since Monday.   Assessment & Plan: Acute on chronic renal failure: Stage III at baseline secondary to diabetes most likely -Patient currently oliguric -Received emergent hemodialysis on 06/04/2018 -No need for hemodialysis at this point -Creatinine up to 5.3 -Metabolic acidosis resolved -Electrolytes stable currently -Continue IV fluids -Follow renal function trend and recovery in his ability to make urine -Follow nephrology recommendations. -Continue avoiding nephrotoxic agents.  SIRS -Currently afebrile -COVID-19 -Continue to follow culture results -Given no specific source of infection we will discontinue antibiotics and watch clinical response -Very possible that his service is secondary to dehydration, acute renal failure and uremia.  Nausea/vomiting -Secondary to uremia versus underlying viral gastroenteritis -Continue as needed antiemetics -Continue IV fluids -Advance diet as tolerated.  Hyperkalemia/hypokalemia -In the setting of acute renal failure -Improved and resolved after hemodialysis -Patient also receive bicarbonate, Lokelma and calcium gluconate at time of admission. -Potassium 2.9; will replete and follow trend.  Type II diabetes mellitus with nephropathy -Continue sliding scale insulin -Follow CBGs and adjust hypoglycemic regimen as needed -Continue holding oral hypoglycemic agents.  Bipolar disorder/schizophrenia -Continue Depakote and Geodon -No suicidal  ideation or hallucinations currently.  Elevated transaminitis -No prior history of liver disease -Continue holding statins -Follow LFTs trend -Most likely associated with shock liver and rhabdomyolysis.  Essential HTN -BP stable currently (borderline low) -continue holding antihypertensive agents)  DVT prophylaxis: Heparin Code Status: Full code Family Communication: No family at bedside Disposition Plan: Continue IV fluids, follow renal function and electrolytes trend, follow urine output, follow nephrology recommendations.  Consultants:   Nephrology service  Procedures:   See below for x-ray reports.  Antimicrobials:  Anti-infectives (From admission, onward)   Start     Dose/Rate Route Frequency Ordered Stop   06/04/18 2200  piperacillin-tazobactam (ZOSYN) IVPB 2.25 g     2.25 g 100 mL/hr over 30 Minutes Intravenous Every 8 hours 06/04/18 1250     06/04/18 1300  piperacillin-tazobactam (ZOSYN) IVPB 3.375 g     3.375 g 100 mL/hr over 30 Minutes Intravenous  Once 06/04/18 1250 06/04/18 1432   06/04/18 1100  cefTRIAXone (ROCEPHIN) 1 g in sodium chloride 0.9 % 100 mL IVPB     1 g 200 mL/hr over 30 Minutes Intravenous  Once 06/04/18 1054 06/04/18 1215      Subjective: No fever, no chest pain, no nausea, no vomiting.  Patient reported no shortness of breath.  Mentation continued to improved (even is still not back to baseline) and he is demonstrating better insight.  Remain oliguric.  Objective: Vitals:   06/06/18 0530 06/06/18 0932 06/06/18 1340 06/06/18 1717  BP: 110/67 (!) 109/59 114/66 125/71  Pulse: 79 88 93 97  Resp: 19 20 15 20   Temp: 98.8 F (37.1 C) 98 F (36.7 C) 99 F (37.2 C) 98.1 F (36.7 C)  TempSrc: Oral Oral Oral Oral  SpO2: 99%  99% 99%  Weight:        Intake/Output Summary (Last 24 hours) at 06/06/2018 1843  Last data filed at 06/06/2018 0300 Gross per 24 hour  Intake 1366.64 ml  Output -  Net 1366.64 ml   Filed Weights   06/04/18 1715  06/04/18 2006 06/04/18 2056  Weight: 56.4 kg 56.7 kg 56.7 kg    Examination: General exam: Alert, awake, oriented x 2; with overall improved insight during evaluation.  Denies chest pain, shortness of breath, nausea or vomiting.  Reports an improvement in his appetite.  Still has remained to be oliguric. Respiratory system: Clear to auscultation. Respiratory effort normal. Cardiovascular system:RRR. No murmurs, rubs, gallops. Gastrointestinal system: Abdomen is nondistended, soft and nontender. No organomegaly or masses felt. Normal bowel sounds heard. Central nervous system: Alert and oriented. No focal neurological deficits. Extremities: No C/C/E, +pedal pulses Skin: No rashes, lesions or ulcers Psychiatry: Judgement and insight appear improved. Mood & affect appropriate.  No hallucinations.   Data Reviewed: I have personally reviewed following labs and imaging studies  CBC: Recent Labs  Lab 06/04/18 0957 06/05/18 0715 06/06/18 0316  WBC 13.5* 7.0 6.7  NEUTROABS 12.0*  --   --   HGB 12.1* 10.0* 9.8*  HCT 39.4 30.1* 28.5*  MCV 81.9 74.7* 75.4*  PLT 242 205 161   Basic Metabolic Panel: Recent Labs  Lab 06/04/18 0957 06/04/18 1504 06/05/18 0715 06/06/18 0316  NA 133* 136 139 139  K 7.0* 6.3* 3.2* 2.9*  CL 97* 99 92* 98  CO2 <7* 9* 26 24  GLUCOSE 66* 206* 106* 105*  BUN 88* 84* 26* 30*  CREATININE 8.86* 8.35* 4.87* 5.73*  CALCIUM 8.1* 7.4* 8.7* 8.0*   GFR: CrCl cannot be calculated (Unknown ideal weight.).   Liver Function Tests: Recent Labs  Lab 06/04/18 0957 06/05/18 0715 06/06/18 0316  AST 262* 199* 213*  ALT 184* 157* 162*  ALKPHOS 48 35* 31*  BILITOT 0.7 0.6 0.7  PROT 7.3 5.4* 4.9*  ALBUMIN 3.8 2.7* 2.5*   Coagulation Profile: Recent Labs  Lab 06/04/18 1044  INR 1.1   Cardiac Enzymes: Recent Labs  Lab 06/05/18 1207 06/06/18 0316  CKTOTAL 12,583* 6,870*   HbA1C: Recent Labs    06/05/18 0715  HGBA1C 5.9*   CBG: Recent Labs  Lab  06/05/18 2028 06/06/18 0520 06/06/18 0824 06/06/18 1157 06/06/18 1642  GLUCAP 121* 86 84 84 85   Anemia Panel: Recent Labs    06/05/18 1207  VITAMINB12 275  FERRITIN 218  TIBC 178*  IRON 93   Urine analysis:    Component Value Date/Time   COLORURINE AMBER (A) 06/06/2018 1042   APPEARANCEUR HAZY (A) 06/06/2018 1042   LABSPEC 1.020 06/06/2018 1042   PHURINE 5.0 06/06/2018 1042   GLUCOSEU 50 (A) 06/06/2018 1042   HGBUR LARGE (A) 06/06/2018 1042   BILIRUBINUR NEGATIVE 06/06/2018 1042   KETONESUR NEGATIVE 06/06/2018 1042   PROTEINUR 100 (A) 06/06/2018 1042   NITRITE NEGATIVE 06/06/2018 1042   LEUKOCYTESUR NEGATIVE 06/06/2018 1042    Recent Results (from the past 240 hour(s))  Blood Culture (routine x 2)     Status: None (Preliminary result)   Collection Time: 06/04/18  9:57 AM  Result Value Ref Range Status   Specimen Description   Final    BLOOD RIGHT HAND Performed at [facility], [place] [address] [place], [place] [address]    Special Requests   Final    BOTTLES DRAWN AEROBIC ONLY Blood Culture adequate volume Performed at [facility] [address], [place], [place] [address]    Culture  Final    NO GROWTH 2 DAYS Performed at [facility], [place] [address] [place], [place] [address]    Report Status PENDING  Incomplete  Blood Culture (routine x 2)     Status: None (Preliminary result)   Collection Time: 06/04/18 10:00 AM  Result Value Ref Range Status   Specimen Description   Final    BLOOD LEFT HAND Performed at [facility] [address], [place], [place] [address]    Special Requests   Final    BOTTLES DRAWN AEROBIC AND ANAEROBIC Blood Culture results may not be optimal due to an inadequate volume of blood received in culture bottles Performed at [facility] [address], [place], [place] [address]    Culture   Final    NO GROWTH 2 DAYS Performed at [facility] [address], [place], [place] [address]    Report Status PENDING  Incomplete  SARS Coronavirus 2 (CEPHEID - Performed in [place] hospital lab), Hosp Order     Status: None   Collection Time: 06/04/18 10:00 AM  Result Value Ref Range Status   SARS Coronavirus 2 NEGATIVE NEGATIVE Final    Comment: (NOTE) If result is NEGATIVE SARS-CoV-2 target nucleic acids are NOT DETECTED. The SARS-CoV-2 RNA is generally detectable in upper and lower  respiratory specimens during the acute phase of infection. The lowest  concentration of SARS-CoV-2 viral copies this assay can detect is 250  copies / mL. A negative result does not preclude SARS-CoV-2 infection  and should not be used as the sole basis for treatment or other  patient management decisions.  A negative result may occur with  improper specimen collection / handling, submission of specimen other  than nasopharyngeal swab, presence of viral mutation(s) within the  areas targeted by this assay, and inadequate number of viral copies  (<250 copies / mL). A negative result must be combined with clinical  observations, patient history, and epidemiological information. If result is POSITIVE SARS-CoV-2 target nucleic acids are DETECTED. The SARS-CoV-2 RNA is generally detectable in upper and lower  respiratory specimens dur ing the acute phase of infection.  Positive  results are indicative of active infection with SARS-CoV-2.  Clinical  correlation with patient history and other diagnostic information is  necessary to determine patient infection status.  Positive results do  not rule out bacterial infection or co-infection with other viruses. If result is PRESUMPTIVE POSTIVE SARS-CoV-2 nucleic acids MAY BE PRESENT.   A presumptive positive result was obtained on the submitted specimen  and confirmed on repeat testing.  While 2019 novel coronavirus  (SARS-CoV-2) nucleic acids may be present in the submitted sample  additional  confirmatory testing may be necessary for epidemiological  and / or clinical management purposes  to differentiate between  SARS-CoV-2 and other Sarbecovirus currently known to infect humans.  If clinically indicated additional testing with an alternate test  methodology [phone]) is advised. The SARS-CoV-2 RNA is generally  detectable in upper and lower respiratory sp ecimens during the acute  phase of infection. The expected result is Negative. Fact Sheet for Patients:  StrictlyIdeas.no Fact Sheet for Healthcare Providers: BankingDealers.co.za This test is not yet approved or cleared by the [place] FDA and has been authorized for detection and/or diagnosis of SARS-CoV-2 by FDA under an Emergency Use Authorization (EUA).  This EUA will remain in effect (meaning this test can be used) for the duration of the COVID-19 declaration under  Section 564(b)(1) of the Act, 21 U.S.C. section 360bbb-3(b)(1), unless the authorization is terminated or revoked sooner. Performed at [facility], [place] [address], [place], [place] [address]   MRSA PCR Screening     Status: None   Collection Time: 06/06/18  1:50 PM  Result Value Ref Range Status   MRSA by PCR NEGATIVE NEGATIVE Final    Comment:        The GeneXpert MRSA Assay (FDA approved for NASAL specimens only), is one component of a comprehensive MRSA colonization surveillance program. It is not intended to diagnose MRSA infection nor to guide or monitor treatment for MRSA infections. Performed at [facility], [place] [address] [place], [place] [address]      Radiology Studies: No results found.  Scheduled Meds: . aspirin  81 mg Oral Daily  . Chlorhexidine Gluconate Cloth  6 each Topical Q0600  . divalproex  125 mg Oral QHS  . ferrous sulfate  325 mg Oral Q breakfast  . fluticasone  1 spray Each Nare Daily  . heparin  5,000 Units Subcutaneous Q8H  .  insulin aspart  0-9 Units Subcutaneous Q4H  . potassium chloride  20 mEq Oral Daily  . traZODone  150 mg Oral QHS  . ziprasidone  60 mg Oral Daily   Continuous Infusions: . sodium chloride 150 mL/hr at 06/06/18 1038  . piperacillin-tazobactam (ZOSYN)  IV 2.25 g (06/06/18 1356)  . sodium chloride       LOS: 2 days    Time spent: 35 minutes.  More than 50% of the time dedicated to face-to-face examination, discussion about acute illness and planning/coordinating care.    Case have been discussed with nephrologist and nursing staff updated.  Patient remains oliguric; Continue to follow renal function trend, avoiding nephrotoxic agents and determine need for further hemodialysis.  Overall with improved mentation.  Given no source of infection will discontinue antibiotics and watch him.    Barton Dubois, MD [facility] Pager [phone]  06/06/2018, 6:43 PM

## 2018-06-06 NOTE — Progress Notes (Signed)
Patient ID: ARKEEM HARTS, male   DOB: [birth year], 63 y.o.   MRN: [id number] S:More awake and alert today, sitting in bed eating breakfast O:BP (!) 109/59 (BP Location: Left Arm)   Pulse 88   Temp 98 F (36.7 C) (Oral)   Resp 20   Wt 56.7 kg   SpO2 99%   BMI 22.14 kg/m   Intake/Output Summary (Last 24 hours) at 06/06/2018 1151 Last data filed at 06/06/2018 0300 Gross per 24 hour  Intake 2016.64 ml  Output -  Net 2016.64 ml   Intake/Output: I/O last 3 completed shifts: In: 2560.6 [P.O.:544; I.V.:1916.6; IV Piggyback:100] Out: 0   Intake/Output this shift:  No intake/output data recorded. Weight change:  Gen: NAD CVS:no rub Resp: cta Abd: benign Ext: no edema  Recent Labs  Lab 06/04/18 0957 06/04/18 1504 06/05/18 0715 06/06/18 0316  NA 133* 136 139 139  K 7.0* 6.3* 3.2* 2.9*  CL 97* 99 92* 98  CO2 <7* 9* 26 24  GLUCOSE 66* 206* 106* 105*  BUN 88* 84* 26* 30*  CREATININE 8.86* 8.35* 4.87* 5.73*  ALBUMIN 3.8  --  2.7* 2.5*  CALCIUM 8.1* 7.4* 8.7* 8.0*  AST 262*  --  199* 213*  ALT 184*  --  157* 162*   Liver Function Tests: Recent Labs  Lab 06/04/18 0957 06/05/18 0715 06/06/18 0316  AST 262* 199* 213*  ALT 184* 157* 162*  ALKPHOS 48 35* 31*  BILITOT 0.7 0.6 0.7  PROT 7.3 5.4* 4.9*  ALBUMIN 3.8 2.7* 2.5*   No results for input(s): LIPASE, AMYLASE in the last 168 hours. No results for input(s): AMMONIA in the last 168 hours. CBC: Recent Labs  Lab 06/04/18 0957 06/05/18 0715 06/06/18 0316  WBC 13.5* 7.0 6.7  NEUTROABS 12.0*  --   --   HGB 12.1* 10.0* 9.8*  HCT 39.4 30.1* 28.5*  MCV 81.9 74.7* 75.4*  PLT 242 205 160   Cardiac Enzymes: Recent Labs  Lab 06/05/18 1207 06/06/18 0316  CKTOTAL 12,583* 6,870*   CBG: Recent Labs  Lab 06/05/18 1245 06/05/18 1608 06/05/18 2028 06/06/18 0520 06/06/18 0824  GLUCAP 120* 96 121* 86 84    Iron Studies:  Recent Labs    06/05/18 1207  IRON 93  TIBC 178*  FERRITIN 218   Studies/Results: US  Renal  Result Date: 06/04/2018 CLINICAL DATA:  Weakness, hyperglycemic EXAM: RENAL / URINARY TRACT ULTRASOUND COMPLETE COMPARISON:  None. FINDINGS: Right Kidney: Renal measurements: 11.7 x 5.8 x 6 cm = volume: 211 mL. Parenchyma is hyperechoic to the adjacent liver. No mass or hydronephrosis visualized. Left Kidney: Renal measurements: 10.9 x 5.9 x 5.6 cm = volume: 186 mL. Echogenic parenchyma. No hydronephrosis. 1 x 0.8 cm cyst in the upper pole. Bladder: Appears normal for degree of bladder distention. IMPRESSION: 1. Echogenic renal parenchyma, a nonspecific indicator of medical renal disease. 2. No hydronephrosis. Electronically Signed   By: Lucrezia Europe M.D.   On: 06/04/2018 16:52   Dg Chest Port 1 View  Result Date: 06/04/2018 CLINICAL DATA:  Worsening nausea vomiting for 3 days EXAM: PORTABLE CHEST 1 VIEW COMPARISON:  None. FINDINGS: There is a right jugular central venous catheter with the tip projecting over the SVC. There is no focal parenchymal opacity. There is no pleural effusion or pneumothorax. The heart and mediastinal contours are unremarkable. There is gaseous distension of the stomach. The osseous structures are unremarkable. IMPRESSION: No active disease. Electronically Signed   By: Kathreen Devoid  On: 06/04/2018 12:47   . aspirin  81 mg Oral Daily  . Chlorhexidine Gluconate Cloth  6 each Topical Q0600  . divalproex  125 mg Oral QHS  . ferrous sulfate  325 mg Oral Q breakfast  . fluticasone  1 spray Each Nare Daily  . heparin  5,000 Units Subcutaneous Q8H  . insulin aspart  0-9 Units Subcutaneous Q4H  . traZODone  150 mg Oral QHS  . ziprasidone  60 mg Oral Daily    BMET    Component Value Date/Time   NA 139 06/06/2018 0316   K 2.9 (L) 06/06/2018 0316   CL 98 06/06/2018 0316   CO2 24 06/06/2018 0316   GLUCOSE 105 (H) 06/06/2018 0316   BUN 30 (H) 06/06/2018 0316   CREATININE 5.73 (H) 06/06/2018 0316   CALCIUM 8.0 (L) 06/06/2018 0316   GFRNONAA 10 (L) 06/06/2018 0316    GFRAA 11 (L) 06/06/2018 0316   CBC    Component Value Date/Time   WBC 6.7 06/06/2018 0316   RBC 3.78 (L) 06/06/2018 0316   HGB 9.8 (L) 06/06/2018 0316   HCT 28.5 (L) 06/06/2018 0316   PLT 160 06/06/2018 0316   MCV 75.4 (L) 06/06/2018 0316   MCH 25.9 (L) 06/06/2018 0316   MCHC 34.4 06/06/2018 0316   RDW 13.2 06/06/2018 0316   LYMPHSABS 0.6 (L) 06/04/2018 0957   MONOABS 0.8 06/04/2018 0957   EOSABS 0.1 06/04/2018 0957   BASOSABS 0.0 06/04/2018 0957    Assessment/Plan:  1. AKI/CKD stage 3 (last known Cr was 1.42 in Dec 2017). Oliguric presumably multifactorial; volume depletion due to N/V in setting of concomitant ACE inhibition and diuretics with superimposed rhabdomyolysis.  Required urgent HD on 06/04/18 due to hyperkalemia.  Receiving IVF's but remains oliguric.  [place] without hydro but did show increased echogenicity consistent with chronic medical renal disease. 1. Check bladder scan again today and if PVR >300 place foley, currently with condom cath 2. Continue to hold benazepril, hctz, and glucophage 3. Avoid nephrotoxic agents 4. Switch ivfs to NS now that metabolic acidosis has resolved. 5. No indication for dialysis at this time.  Continue to follow closely 2. Metabolic acidosis- due to #1 and concomitant metformin.  Improved with isotonic bicarb 3. Rhabdomyolysis- likely contributing to AKI but unclear etiology other than statin therapy.  No history of falls or being found down.  Increase IVF's and follow levels. 4. Hyperkalemia- improved with HD, lokelma, and IV bicarb 1. Now hypokalemic.   2. Replete and follow 5. Anemia- presumably due to CKD, however unknown baseline Cr.  Will check SPEP/UPEP, iron stores and follow.  Would also recommend guaiac stools 6. SIRS- on zosyn and cultures pending. covid-19 negative 7. N/V- unclear if this is source of AKI or due to renal failure.  None since admission.  8. DM- per primary svc 9. HTN- low BP's on arrival, meds on  hold 10. Elevated AST/ALT- likely due to shock liver but r/o infectious and agree with holding statin in setting of rhabdo.  Donetta Potts, MD [employer] [phone]

## 2018-06-06 NOTE — Progress Notes (Addendum)
Pharmacy Antibiotic Note  Chad Mathis is a 63 y.o. male admitted on 06/04/2018 with sepsis.  Pharmacyconsulted on 5/28 for Zosyn dosing. Noted to have new AKI with hyperkalemia on admission. Had  urgent HD 5/28 due to hyperkalemia. Today K 2.9, MD  replacing 5/30 6meq po x1.  AKI/CKD:   SCr 8.35> 4.87>5.73 WBC improved /wnl,  afebrile,  Tm 99 Blood cultures  No growth to date.  Zosyn  5/28>> Ceftriaxone x1 5/28 only  5/28  covid-19 negative 5/28 BCx : ngtd  Plan: Continue  Zosyn 2.25 g IV q8 hr F/u renal fxn, BCx's  , LOT   Weight: 125 lb (56.7 kg)  Temp (24hrs), Avg:98.5 F (36.9 C), Min:98 F (36.7 C), Max:99 F (37.2 C)  Recent Labs  Lab 06/04/18 0957 06/04/18 1000 06/04/18 1142 06/04/18 1504 06/05/18 0715 06/06/18 0316  WBC 13.5*  --   --   --  7.0 6.7  CREATININE 8.86*  --   --  8.35* 4.87* 5.73*  LATICACIDVEN  --  6.2* 7.5*  --  3.0*  --     CrCl cannot be calculated (Unknown ideal weight.).    No Known Allergies   Thank you for allowing pharmacy to be a part of this patient's care.  Nicole Cella, RPh Clinical Pharmacist Pager: [phone] Please check AMION for all [facility] phone numbers After 10:00 PM, call [place] [phone] 06/06/2018, 11:02 AM

## 2018-06-07 LAB — COMPREHENSIVE METABOLIC PANEL
ALT: 161 U/L — ABNORMAL HIGH (ref 0–44)
AST: 215 U/L — ABNORMAL HIGH (ref 15–41)
Albumin: 2.2 g/dL — ABNORMAL LOW (ref 3.5–5.0)
Alkaline Phosphatase: 30 U/L — ABNORMAL LOW (ref 38–126)
Anion gap: 13 (ref 5–15)
BUN: 33 mg/dL — ABNORMAL HIGH (ref 8–23)
CO2: 22 mmol/L (ref 22–32)
Calcium: 7.7 mg/dL — ABNORMAL LOW (ref 8.9–10.3)
Chloride: 105 mmol/L (ref 98–111)
Creatinine, Ser: 6.48 mg/dL — ABNORMAL HIGH (ref 0.61–1.24)
GFR calc Af Amer: 10 mL/min — ABNORMAL LOW (ref >60)
GFR calc non Af Amer: 8 mL/min — ABNORMAL LOW (ref >60)
Glucose, Bld: 80 mg/dL (ref 70–99)
Potassium: 2.9 mmol/L — ABNORMAL LOW (ref 3.5–5.1)
Sodium: 140 mmol/L (ref 135–145)
Total Bilirubin: 0.6 mg/dL (ref 0.3–1.2)
Total Protein: 4.5 g/dL — ABNORMAL LOW (ref 6.5–8.1)

## 2018-06-07 LAB — URINE CULTURE: Culture: NO GROWTH

## 2018-06-07 LAB — GLUCOSE, CAPILLARY
Glucose-Capillary: 71 mg/dL (ref 70–99)
Glucose-Capillary: 77 mg/dL (ref 70–99)
Glucose-Capillary: 73 mg/dL (ref 70–99)
Glucose-Capillary: 90 mg/dL (ref 70–99)
Glucose-Capillary: 70 mg/dL (ref 70–99)
Glucose-Capillary: 79 mg/dL (ref 70–99)

## 2018-06-07 LAB — CK: Total CK: 9870 U/L — ABNORMAL HIGH (ref 49–397)

## 2018-06-07 MED ORDER — POTASSIUM CHLORIDE 10 MEQ/100ML IV SOLN
10.0000 meq | INTRAVENOUS | Status: AC
  Administered 2018-06-07 (×2): 10 meq via INTRAVENOUS
  Filled 2018-06-07 (×2): qty 100

## 2018-06-07 MED ORDER — TEMAZEPAM 7.5 MG PO CAPS
7.5000 mg | ORAL_CAPSULE | Freq: Every evening | ORAL | Status: DC | PRN
  Administered 2018-06-16: 23:00:00 7.5 mg via ORAL
  Filled 2018-06-07: qty 1

## 2018-06-07 NOTE — Progress Notes (Signed)
Patient ID: Chad Mathis, male   DOB: May 20, 1955, 63 y.o.   MRN: [id number] S: No complaints, more awake and alert this am.  PVR was >300 and now s/p foley cath placement yesterday. O:BP 124/76 (BP Location: Left Arm)   Pulse 91   Temp 98.2 F (36.8 C) (Oral)   Resp 19   Wt 56.7 kg   SpO2 99%   BMI 22.14 kg/m   Intake/Output Summary (Last 24 hours) at 06/07/2018 1301 Last data filed at 06/07/2018 1018 Gross per 24 hour  Intake -  Output 150 ml  Net -150 ml   Intake/Output: I/O last 3 completed shifts: In: 1366.6 [I.V.:1316.6; IV Piggyback:50] Out: -   Intake/Output this shift:  Total I/O In: -  Out: 150 [Urine:150] Weight change:  Gen:NAD CVS: no rub  Resp: cta Abd: benign Ext: no edema  Recent Labs  Lab 06/04/18 0957 06/04/18 1504 06/05/18 0715 06/06/18 0316 06/07/18 0349  NA 133* 136 139 139 140  K 7.0* 6.3* 3.2* 2.9* 2.9*  CL 97* 99 92* 98 105  CO2 <7* 9* 26 24 22   GLUCOSE 66* 206* 106* 105* 80  BUN 88* 84* 26* 30* 33*  CREATININE 8.86* 8.35* 4.87* 5.73* 6.48*  ALBUMIN 3.8  --  2.7* 2.5* 2.2*  CALCIUM 8.1* 7.4* 8.7* 8.0* 7.7*  AST 262*  --  199* 213* 215*  ALT 184*  --  157* 162* 161*   Liver Function Tests: Recent Labs  Lab 06/05/18 0715 06/06/18 0316 06/07/18 0349  AST 199* 213* 215*  ALT 157* 162* 161*  ALKPHOS 35* 31* 30*  BILITOT 0.6 0.7 0.6  PROT 5.4* 4.9* 4.5*  ALBUMIN 2.7* 2.5* 2.2*   No results for input(s): LIPASE, AMYLASE in the last 168 hours. No results for input(s): AMMONIA in the last 168 hours. CBC: Recent Labs  Lab 06/04/18 0957 06/05/18 0715 06/06/18 0316  WBC 13.5* 7.0 6.7  NEUTROABS 12.0*  --   --   HGB 12.1* 10.0* 9.8*  HCT 39.4 30.1* 28.5*  MCV 81.9 74.7* 75.4*  PLT 242 205 160   Cardiac Enzymes: Recent Labs  Lab 06/05/18 1207 06/06/18 0316 06/07/18 0349  CKTOTAL 12,583* 6,870* 9,870*   CBG: Recent Labs  Lab 06/06/18 2036 06/07/18 0136 06/07/18 0401 06/07/18 0805 06/07/18 1213  GLUCAP 128* 71 77  73 90    Iron Studies:  Recent Labs    06/05/18 1207  IRON 93  TIBC 178*  FERRITIN 218   Studies/Results: No results found. [place] aspirin  81 mg Oral Daily  . Chlorhexidine Gluconate Cloth  6 each Topical Q0600  . divalproex  125 mg Oral QHS  . ferrous sulfate  325 mg Oral Q breakfast  . fluticasone  1 spray Each Nare Daily  . heparin  5,000 Units Subcutaneous Q8H  . insulin aspart  0-9 Units Subcutaneous Q4H  . potassium chloride  20 mEq Oral Daily  . traZODone  150 mg Oral QHS  . ziprasidone  60 mg Oral Daily    BMET    Component Value Date/Time   NA 140 06/07/2018 0349   K 2.9 (L) 06/07/2018 0349   CL 105 06/07/2018 0349   CO2 22 06/07/2018 0349   GLUCOSE 80 06/07/2018 0349   BUN 33 (H) 06/07/2018 0349   CREATININE 6.48 (H) 06/07/2018 0349   CALCIUM 7.7 (L) 06/07/2018 0349   GFRNONAA 8 (L) 06/07/2018 0349   GFRAA 10 (L) 06/07/2018 0349   CBC    Component Value  Date/Time   WBC 6.7 06/06/2018 0316   RBC 3.78 (L) 06/06/2018 0316   HGB 9.8 (L) 06/06/2018 0316   HCT 28.5 (L) 06/06/2018 0316   PLT 160 06/06/2018 0316   MCV 75.4 (L) 06/06/2018 0316   MCH 25.9 (L) 06/06/2018 0316   MCHC 34.4 06/06/2018 0316   RDW 13.2 06/06/2018 0316   LYMPHSABS 0.6 (L) 06/04/2018 0957   MONOABS 0.8 06/04/2018 0957   EOSABS 0.1 06/04/2018 0957   BASOSABS 0.0 06/04/2018 0957    Assessment/Plan:  1. AKI/CKD stage 3 (last known Cr was 1.42 in Dec 2017). Oliguric presumably multifactorial; volume depletion due to N/V in setting of concomitant ACE inhibition and diuretics with superimposed rhabdomyolysis. Required urgent HD on 06/04/18 due to hyperkalemia. Receiving IVF's but remains oliguric. [place] without hydro but did show increased echogenicity consistent with chronic medical renal disease. 1. Check bladder scan again today and if PVR >300 place foley, currently with condom cath 2. Continue to hold benazepril, hctz, and glucophage 3. Avoid nephrotoxic agents 4. Switch ivfs to NS  now that metabolic acidosis has resolved. 5. UOP improving s/p foley but remains oliguric 6. Temp HD cath placed 06/04/18 7. No indication for dialysis at this time.  Continue to follow closely 2. Metabolic acidosis- due to #1 and concomitant metformin. Improved with isotonic bicarb 3. Rhabdomyolysis- likely contributing to AKI but unclear etiology other than statin therapy. No history of falls or being found down. Increase IVF's and follow levels. 4. Hyperkalemia- improved with HD, lokelma, and IV bicarb 1. Now hypokalemic.  2. Replete and follow 5. Anemia- presumably due to CKD, however unknown baseline Cr. Will check SPEP/UPEP, iron stores and follow. Would also recommend guaiac stools 6. SIRS- on zosyn and cultures pending. covid-19 negative 7. N/V- unclear if this is source of AKI or due to renal failure. None since admission.  8. DM- per primary svc 9. HTN- low BP's on arrival, meds on hold 10. Elevated AST/ALT- likely due to shock liver but r/o infectious and agree with holding statin in setting of rhabdo.  Donetta Potts, MD [employer] [phone]

## 2018-06-07 NOTE — Progress Notes (Signed)
PROGRESS NOTE    Chad Mathis  JAS:[id number] DOB: 09/27/1955 DOA: 06/04/2018 PCP: Verner Chol, MD     Brief Narrative:  63 y.o. male with medical history significant of diabetes, bipolar disorder.  Patient reports nausea and vomiting for the last 3 days.  The symptoms have worsened.  He has not been able to tolerate a diet.  He has not taken anything to help with the symptoms.  No associated chest pain, shortness of breath, abdominal pain, diarrhea. He reports decreased urine output since Monday.   Assessment & Plan: Acute on chronic renal failure: Stage III at baseline secondary to diabetes most likely -Patient currently oliguric -Received emergent hemodialysis on 06/04/2018 -No need for hemodialysis at this point -Creatinine up to 6.4 -Metabolic acidosis resolved -Electrolytes stable currently -Continue IV fluids -Follow renal function trend and recovery in his ability to make urine -Follow nephrology recommendations. -Continue avoiding nephrotoxic agents.  SIRS -Currently afebrile -COVID-19 -Continue to follow final culture results -Continue monitoring off antibiotics -Very possible that his service is secondary to dehydration, acute renal failure and uremia.  Nausea/vomiting -Secondary to uremia versus underlying viral gastroenteritis -Continue as needed antiemetics -Continue IV fluids -Advance diet as tolerated.  Hyperkalemia/hypokalemia -In the setting of acute renal failure -Improved and resolved after hemodialysis -Patient also receive bicarbonate, Lokelma and calcium gluconate at time of admission. -Potassium 2.9; will continue repletion and follow trend.  Type II diabetes mellitus with nephropathy -Continue sliding scale insulin -Follow CBGs and adjust hypoglycemic regimen as needed -Continue holding oral hypoglycemic agents.  Bipolar disorder/schizophrenia -Continue Depakote and Geodon -No suicidal ideation or hallucinations currently.  Elevated  transaminitis -No prior history of liver disease -Continue holding statins -Follow LFTs trend -Most likely associated with shock liver and rhabdomyolysis.  Essential HTN -BP stable currently (borderline low) -continue holding antihypertensive agents)  Urinary retention -Continue the use of Foley catheter.  Insomnia -will use as needed Restoril   DVT prophylaxis: Heparin Code Status: Full code Family Communication: No family at bedside Disposition Plan: Continue IV fluids, follow renal function and electrolytes trend, follow urine output, follow nephrology recommendations.  Consultants:   Nephrology service  Procedures:   See below for x-ray reports.  Antimicrobials:  Anti-infectives (From admission, onward)   Start     Dose/Rate Route Frequency Ordered Stop   06/04/18 2200  piperacillin-tazobactam (ZOSYN) IVPB 2.25 g  Status:  Discontinued     2.25 g 100 mL/hr over 30 Minutes Intravenous Every 8 hours 06/04/18 1250 06/06/18 1846   06/04/18 1300  piperacillin-tazobactam (ZOSYN) IVPB 3.375 g     3.375 g 100 mL/hr over 30 Minutes Intravenous  Once 06/04/18 1250 06/04/18 1432   06/04/18 1100  cefTRIAXone (ROCEPHIN) 1 g in sodium chloride 0.9 % 100 mL IVPB     1 g 200 mL/hr over 30 Minutes Intravenous  Once 06/04/18 1054 06/04/18 1215      Subjective: Still oliguric but making more urine.  Denies chest pain, nausea/vomiting and shortness of breath.  Reports difficulty sleeping at nighttime.  Objective: Vitals:   06/06/18 2053 06/07/18 0406 06/07/18 0911 06/07/18 1337  BP:  131/72 124/76 122/81  Pulse:  91  88  Resp:  (!) 22 19 18   Temp: 98.2 F (36.8 C) 98.9 F (37.2 C) 98.2 F (36.8 C) 98.1 F (36.7 C)  TempSrc: Oral Oral Oral Oral  SpO2:  99% 99%   Weight:        Intake/Output Summary (Last 24 hours) at 06/07/2018 1534 Last data filed  at 06/07/2018 1348 Gross per 24 hour  Intake 500 ml  Output 150 ml  Net 350 ml   Filed Weights   06/04/18 1715  06/04/18 2006 06/04/18 2056  Weight: 56.4 kg 56.7 kg 56.7 kg    Examination: General exam: Alert, awake, oriented x 3; reports improvement in appetite; no nausea, no vomiting.  Still only uric will have insulin increase in the amount of urine that he is making.  Denies shortness of breath or chest pain. Respiratory system: Clear to auscultation. Respiratory effort normal. Cardiovascular system:RRR. No murmurs, rubs, gallops. Gastrointestinal system: Abdomen is nondistended, soft and nontender. No organomegaly or masses felt. Normal bowel sounds heard. Central nervous system: Alert and oriented. No focal neurological deficits. Extremities: No C/C/E, +pedal pulses Skin: No rashes, lesions or ulcers Psychiatry: Judgement and insight appear normal. Mood & affect appropriate.    Data Reviewed: I have personally reviewed following labs and imaging studies  CBC: Recent Labs  Lab 06/04/18 0957 06/05/18 0715 06/06/18 0316  WBC 13.5* 7.0 6.7  NEUTROABS 12.0*  --   --   HGB 12.1* 10.0* 9.8*  HCT 39.4 30.1* 28.5*  MCV 81.9 74.7* 75.4*  PLT 242 205 678   Basic Metabolic Panel: Recent Labs  Lab 06/04/18 0957 06/04/18 1504 06/05/18 0715 06/06/18 0316 06/07/18 0349  NA 133* 136 139 139 140  K 7.0* 6.3* 3.2* 2.9* 2.9*  CL 97* 99 92* 98 105  CO2 <7* 9* 26 24 22   GLUCOSE 66* 206* 106* 105* 80  BUN 88* 84* 26* 30* 33*  CREATININE 8.86* 8.35* 4.87* 5.73* 6.48*  CALCIUM 8.1* 7.4* 8.7* 8.0* 7.7*   GFR: CrCl cannot be calculated (Unknown ideal weight.).   Liver Function Tests: Recent Labs  Lab 06/04/18 0957 06/05/18 0715 06/06/18 0316 06/07/18 0349  AST 262* 199* 213* 215*  ALT 184* 157* 162* 161*  ALKPHOS 48 35* 31* 30*  BILITOT 0.7 0.6 0.7 0.6  PROT 7.3 5.4* 4.9* 4.5*  ALBUMIN 3.8 2.7* 2.5* 2.2*   Coagulation Profile: Recent Labs  Lab 06/04/18 1044  INR 1.1   Cardiac Enzymes: Recent Labs  Lab 06/05/18 1207 06/06/18 0316 06/07/18 0349  CKTOTAL 12,583* 6,870*  9,870*   HbA1C: Recent Labs    06/05/18 0715  HGBA1C 5.9*   CBG: Recent Labs  Lab 06/06/18 2036 06/07/18 0136 06/07/18 0401 06/07/18 0805 06/07/18 1213  GLUCAP 128* 71 77 73 90   Anemia Panel: Recent Labs    06/05/18 1207  VITAMINB12 275  FERRITIN 218  TIBC 178*  IRON 93   Urine analysis:    Component Value Date/Time   COLORURINE AMBER (A) 06/06/2018 1042   APPEARANCEUR HAZY (A) 06/06/2018 1042   LABSPEC 1.020 06/06/2018 1042   PHURINE 5.0 06/06/2018 1042   GLUCOSEU 50 (A) 06/06/2018 1042   HGBUR LARGE (A) 06/06/2018 1042   BILIRUBINUR NEGATIVE 06/06/2018 1042   KETONESUR NEGATIVE 06/06/2018 1042   PROTEINUR 100 (A) 06/06/2018 1042   NITRITE NEGATIVE 06/06/2018 1042   LEUKOCYTESUR NEGATIVE 06/06/2018 1042    Recent Results (from the past 240 hour(s))  Blood Culture (routine x 2)     Status: None (Preliminary result)   Collection Time: 06/04/18  9:57 AM  Result Value Ref Range Status   Specimen Description   Final    BLOOD RIGHT HAND Performed at [facility], [place] [address], [place], [place] [address]    Special Requests   Final    BOTTLES DRAWN AEROBIC ONLY Blood Culture adequate  volume Performed at [facility], [place] [address] [place], [place] [address]    Culture   Final    NO GROWTH 2 DAYS Performed at [facility] [address], [place], [place] [address]    Report Status PENDING  Incomplete  Blood Culture (routine x 2)     Status: None (Preliminary result)   Collection Time: 06/04/18 10:00 AM  Result Value Ref Range Status   Specimen Description   Final    BLOOD LEFT HAND Performed at [place] [address] [place], [place] [address]    Special Requests   Final    BOTTLES DRAWN AEROBIC AND ANAEROBIC Blood Culture results may not be optimal due to an inadequate volume of blood received in culture bottles Performed at [place] [address],  [place], [place] [address]    Culture   Final    NO GROWTH 2 DAYS Performed at [place] [address] [place], [place] [address]    Report Status PENDING  Incomplete  SARS Coronavirus 2 (CEPHEID - Performed in [place] hospital lab), Hosp Order     Status: None   Collection Time: 06/04/18 10:00 AM  Result Value Ref Range Status   SARS Coronavirus 2 NEGATIVE NEGATIVE Final    Comment: (NOTE) If result is NEGATIVE SARS-CoV-2 target nucleic acids are NOT DETECTED. The SARS-CoV-2 RNA is generally detectable in upper and lower  respiratory specimens during the acute phase of infection. The lowest  concentration of SARS-CoV-2 viral copies this assay can detect is 250  copies / mL. A negative result does not preclude SARS-CoV-2 infection  and should not be used as the sole basis for treatment or other  patient management decisions.  A negative result may occur with  improper specimen collection / handling, submission of specimen other  than nasopharyngeal swab, presence of viral mutation(s) within the  areas targeted by this assay, and inadequate number of viral copies  (<250 copies / mL). A negative result must be combined with clinical  observations, patient history, and epidemiological information. If result is POSITIVE SARS-CoV-2 target nucleic acids are DETECTED. The SARS-CoV-2 RNA is generally detectable in upper and lower  respiratory specimens dur ing the acute phase of infection.  Positive  results are indicative of active infection with SARS-CoV-2.  Clinical  correlation with patient history and other diagnostic information is  necessary to determine patient infection status.  Positive results do  not rule out bacterial infection or co-infection with other viruses. If result is PRESUMPTIVE POSTIVE SARS-CoV-2 nucleic acids MAY BE PRESENT.   A presumptive positive result was obtained on the submitted specimen  and confirmed on repeat testing.  While 2019 novel  coronavirus  (SARS-CoV-2) nucleic acids may be present in the submitted sample  additional confirmatory testing may be necessary for epidemiological  and / or clinical management purposes  to differentiate between  SARS-CoV-2 and other Sarbecovirus currently known to infect humans.  If clinically indicated additional testing with an alternate test  methodology [id number]) is advised. The SARS-CoV-2 RNA is generally  detectable in upper and lower respiratory sp ecimens during the acute  phase of infection. The expected result is Negative. Fact Sheet for Patients:  StrictlyIdeas.no Fact Sheet for Healthcare Providers: BankingDealers.co.za This test is not yet approved or cleared by the [place] FDA and has been authorized for detection and/or diagnosis of SARS-CoV-2 by FDA under an Emergency Use Authorization (EUA).  This EUA will remain in effect (meaning this test can be used) for the duration of the COVID-19 declaration under Section 564(b)(1) of the Act, 21 U.S.C. section 360bbb-3(b)(1), unless the authorization is terminated or revoked sooner. Performed at [facility], [place] [address], [place], [place] [address]   Urine Culture     Status: None   Collection Time: 06/06/18 10:42 AM  Result Value Ref Range Status   Specimen Description URINE, CATHETERIZED  Final   Special Requests NONE  Final   Culture   Final    NO GROWTH Performed at [facility], [address], [place], [place] [address]    Report Status 06/07/2018 FINAL  Final  MRSA PCR Screening     Status: None   Collection Time: 06/06/18  1:50 PM  Result Value Ref Range Status   MRSA by PCR NEGATIVE NEGATIVE Final    Comment:        The GeneXpert MRSA Assay (FDA approved for NASAL specimens only), is one component of a comprehensive MRSA colonization surveillance program. It is not intended to diagnose MRSA infection nor to guide or  monitor treatment for MRSA infections. Performed at [facility], [place] [address], [place],  [address]      Radiology Studies: No results found.  Scheduled Meds: . aspirin  81 mg Oral Daily  . Chlorhexidine Gluconate Cloth  6 each Topical Q0600  . divalproex  125 mg Oral QHS  . ferrous sulfate  325 mg Oral Q breakfast  . fluticasone  1 spray Each Nare Daily  . heparin  5,000 Units Subcutaneous Q8H  . insulin aspart  0-9 Units Subcutaneous Q4H  . potassium chloride  20 mEq Oral Daily  . traZODone  150 mg Oral QHS  . ziprasidone  60 mg Oral Daily   Continuous Infusions: . sodium chloride 150 mL/hr at 06/07/18 1137  . potassium chloride 10 mEq (06/07/18 1446)  . sodium chloride       LOS: 3 days    Time spent: 35 minutes.  More than 50% of the time dedicated to face-to-face examination, discussion about acute illness and planning/coordinating care.  Patient's case has been discussed with nephrologist and nursing staff has been updated.  Family contacted over the phone and all questions answered.  Patient still remaining alleviated, but making a more urine; urinary retention appreciated and foley catheter placed. No needing HD currently. Will continue IVF's.  Remain hemodynamically stable and without any signs of active infection. Will continue Monitoring off antibiotics.   Barton Dubois, MD [facility] Pager [phone]  06/07/2018, 3:34 PM

## 2018-06-08 LAB — IMMUNOFIXATION ELECTROPHORESIS
IgA: 120 mg/dL (ref 61–437)
IgG (Immunoglobin G), Serum: 507 mg/dL — ABNORMAL LOW (ref 603–1613)
IgM (Immunoglobulin M), Srm: 45 mg/dL (ref 20–172)
Total Protein ELP: 4.9 g/dL — ABNORMAL LOW (ref 6.0–8.5)

## 2018-06-08 LAB — COMPREHENSIVE METABOLIC PANEL
ALT: 193 U/L — ABNORMAL HIGH (ref 0–44)
AST: 242 U/L — ABNORMAL HIGH (ref 15–41)
Albumin: 2.2 g/dL — ABNORMAL LOW (ref 3.5–5.0)
Alkaline Phosphatase: 32 U/L — ABNORMAL LOW (ref 38–126)
Anion gap: 12 (ref 5–15)
BUN: 33 mg/dL — ABNORMAL HIGH (ref 8–23)
CO2: 17 mmol/L — ABNORMAL LOW (ref 22–32)
Calcium: 8 mg/dL — ABNORMAL LOW (ref 8.9–10.3)
Chloride: 107 mmol/L (ref 98–111)
Creatinine, Ser: 7.15 mg/dL — ABNORMAL HIGH (ref 0.61–1.24)
GFR calc Af Amer: 9 mL/min — ABNORMAL LOW (ref >60)
GFR calc non Af Amer: 7 mL/min — ABNORMAL LOW (ref >60)
Glucose, Bld: 80 mg/dL (ref 70–99)
Potassium: 3.5 mmol/L (ref 3.5–5.1)
Sodium: 136 mmol/L (ref 135–145)
Total Bilirubin: 0.7 mg/dL (ref 0.3–1.2)
Total Protein: 4.3 g/dL — ABNORMAL LOW (ref 6.5–8.1)

## 2018-06-08 LAB — GLUCOSE, CAPILLARY
Glucose-Capillary: 69 mg/dL — ABNORMAL LOW (ref 70–99)
Glucose-Capillary: 71 mg/dL (ref 70–99)
Glucose-Capillary: 73 mg/dL (ref 70–99)
Glucose-Capillary: 85 mg/dL (ref 70–99)
Glucose-Capillary: 86 mg/dL (ref 70–99)
Glucose-Capillary: 93 mg/dL (ref 70–99)
Glucose-Capillary: 96 mg/dL (ref 70–99)

## 2018-06-08 LAB — FOLATE RBC
Folate, Hemolysate: 213 ng/mL
Folate, RBC: 753 ng/mL (ref >498)
Hematocrit: 28.3 % — ABNORMAL LOW (ref 37.5–51.0)

## 2018-06-08 LAB — KAPPA/LAMBDA LIGHT CHAINS
Kappa free light chain: 53.6 mg/L — ABNORMAL HIGH (ref 3.3–19.4)
Kappa, lambda light chain ratio: 0.93 (ref 0.26–1.65)
Lambda free light chains: 57.9 mg/L — ABNORMAL HIGH (ref 5.7–26.3)

## 2018-06-08 LAB — CK: Total CK: 11726 U/L — ABNORMAL HIGH (ref 49–397)

## 2018-06-08 MED ORDER — FUROSEMIDE 10 MG/ML IJ SOLN
80.0000 mg | Freq: Two times a day (BID) | INTRAMUSCULAR | Status: DC
  Administered 2018-06-08 – 2018-06-18 (×20): 80 mg via INTRAVENOUS
  Filled 2018-06-08 (×19): qty 8

## 2018-06-08 NOTE — Progress Notes (Signed)
PROGRESS NOTE    Chad Mathis  VEL:[id number] DOB: August 11, 1955 DOA: 06/04/2018 PCP: Verner Chol, MD     Brief Narrative:  63 y.o. male with medical history significant of diabetes, bipolar disorder.  Patient reports nausea and vomiting for the last 3 days.  The symptoms have worsened.  He has not been able to tolerate a diet.  He has not taken anything to help with the symptoms.  No associated chest pain, shortness of breath, abdominal pain, diarrhea. He reports decreased urine output since Monday.   Assessment & Plan: Acute on chronic renal failure: Stage III at baseline secondary to diabetes most likely -Patient currently oliguric -Received emergent hemodialysis on 06/04/2018 -No need for hemodialysis at this point -Creatinine up to 2.58 -Metabolic acidosis resolved -Electrolytes stable currently -Continue IV fluids -Follow renal function trend and recovery in his ability to make urine -Follow nephrology recommendations. -Continue avoiding nephrotoxic agents.  SIRS -Currently afebrile and with normal wBC's -COVID-19 -Continue to follow final culture results (no growth or microorganism isolated) -Continue monitoring off antibiotics -Very possible that his SIRS features were secondary to dehydration, acute renal failure and uremia.  Nausea/vomiting -Secondary to uremia versus underlying viral gastroenteritis -Continue as needed antiemetics -Continue IV fluids -Advance diet as tolerated.  Hyperkalemia/hypokalemia -In the setting of acute renal failure -Improved and resolved after hemodialysis -Patient also receive bicarbonate, Lokelma and calcium gluconate at time of admission. -Potassium 3.5 this morning after repletion. -Continue to follow electrolytes and further replete as needed.     Type II diabetes mellitus with nephropathy -Continue sliding scale insulin -Follow CBGs and adjust hypoglycemic regimen as needed -Continue holding oral hypoglycemic agents.   Bipolar disorder/schizophrenia -Continue Depakote and Geodon -No suicidal ideation or hallucinations currently.  Elevated transaminitis -No prior history of liver disease -Continue holding statins -Follow LFTs trend -Most likely associated with shock liver and rhabdomyolysis.  Essential HTN -BP stable currently (borderline low) -continue holding antihypertensive agents)  Urinary retention -Continue the use of Foley catheter. -Follow I's and O's  Insomnia -will continue the use of Restoril as needed.   DVT prophylaxis: Heparin Code Status: Full code Family Communication: No family at bedside Disposition Plan: Continue IV fluids, follow renal function and electrolytes trend, follow urine output, follow nephrology recommendations.  Consultants:   Nephrology service  Procedures:   See below for x-ray reports.  Antimicrobials:  Anti-infectives (From admission, onward)   Start     Dose/Rate Route Frequency Ordered Stop   06/04/18 2200  piperacillin-tazobactam (ZOSYN) IVPB 2.25 g  Status:  Discontinued     2.25 g 100 mL/hr over 30 Minutes Intravenous Every 8 hours 06/04/18 1250 06/06/18 1846   06/04/18 1300  piperacillin-tazobactam (ZOSYN) IVPB 3.375 g     3.375 g 100 mL/hr over 30 Minutes Intravenous  Once 06/04/18 1250 06/04/18 1432   06/04/18 1100  cefTRIAXone (ROCEPHIN) 1 g in sodium chloride 0.9 % 100 mL IVPB     1 g 200 mL/hr over 30 Minutes Intravenous  Once 06/04/18 1054 06/04/18 1215      Subjective: Still oliguric; no fever, no chest pain, no nausea, no vomiting, no shortness of breath.  Reports having a better rest after using a sleeping aid medication.  Objective: Vitals:   06/07/18 2139 06/07/18 2325 06/08/18 0641 06/08/18 0755  BP:      Pulse:      Resp:      Temp: 97.9 F (36.6 C) 98.5 F (36.9 C) 98.1 F (36.7 C) (!) 97.5 F (36.4  C)  TempSrc: Oral Oral Oral Oral  SpO2:      Weight:        Intake/Output Summary (Last 24 hours) at 06/08/2018  0819 Last data filed at 06/08/2018 0800 Gross per 24 hour  Intake 2030 ml  Output 150 ml  Net 1880 ml   Filed Weights   06/04/18 1715 06/04/18 2006 06/04/18 2056  Weight: 56.4 kg 56.7 kg 56.7 kg    Examination: General exam: Alert, awake, oriented x 3; reports an improvement in appetite, no chest pain, no shortness of breath, no nausea, no vomiting.  Slight improvement in his ability to sleep last night using as needed sleeping aid medication.  Continues to be oliguric. Respiratory system: Clear to auscultation. Respiratory effort normal. Cardiovascular system:RRR. No murmurs, rubs, gallops. Gastrointestinal system: Abdomen is nondistended, soft and nontender. No organomegaly or masses felt. Normal bowel sounds heard. Central nervous system: Alert and oriented. No focal neurological deficits. Extremities: No C/C/E, +pedal pulses Skin: No rashes, lesions or ulcers Psychiatry: Judgement and insight appear normal. Mood & affect appropriate.    Data Reviewed: I have personally reviewed following labs and imaging studies  CBC: Recent Labs  Lab 06/04/18 0957 06/05/18 0715 06/06/18 0316  WBC 13.5* 7.0 6.7  NEUTROABS 12.0*  --   --   HGB 12.1* 10.0* 9.8*  HCT 39.4 30.1* 28.5*  MCV 81.9 74.7* 75.4*  PLT 242 205 175   Basic Metabolic Panel: Recent Labs  Lab 06/04/18 1504 06/05/18 0715 06/06/18 0316 06/07/18 0349 06/08/18 0403  NA 136 139 139 140 136  K 6.3* 3.2* 2.9* 2.9* 3.5  CL 99 92* 98 105 107  CO2 9* 26 24 22  17*  GLUCOSE 206* 106* 105* 80 80  BUN 84* 26* 30* 33* 33*  CREATININE 8.35* 4.87* 5.73* 6.48* 7.15*  CALCIUM 7.4* 8.7* 8.0* 7.7* 8.0*   GFR: CrCl cannot be calculated (Unknown ideal weight.).   Liver Function Tests: Recent Labs  Lab 06/04/18 0957 06/05/18 0715 06/06/18 0316 06/07/18 0349 06/08/18 0403  AST 262* 199* 213* 215* 242*  ALT 184* 157* 162* 161* 193*  ALKPHOS 48 35* 31* 30* 32*  BILITOT 0.7 0.6 0.7 0.6 0.7  PROT 7.3 5.4* 4.9* 4.5* 4.3*   ALBUMIN 3.8 2.7* 2.5* 2.2* 2.2*   Coagulation Profile: Recent Labs  Lab 06/04/18 1044  INR 1.1   Cardiac Enzymes: Recent Labs  Lab 06/05/18 1207 06/06/18 0316 06/07/18 0349 06/08/18 0403  CKTOTAL 12,583* 6,870* 9,870* 11,726*   HbA1C: No results for input(s): HGBA1C in the last 72 hours. CBG: Recent Labs  Lab 06/07/18 1657 06/07/18 2138 06/08/18 0023 06/08/18 0644 06/08/18 0749  GLUCAP 70 79 73 71 69*   Anemia Panel: Recent Labs    06/05/18 1207  VITAMINB12 275  FERRITIN 218  TIBC 178*  IRON 93   Urine analysis:    Component Value Date/Time   COLORURINE AMBER (A) 06/06/2018 1042   APPEARANCEUR HAZY (A) 06/06/2018 1042   LABSPEC 1.020 06/06/2018 1042   PHURINE 5.0 06/06/2018 1042   GLUCOSEU 50 (A) 06/06/2018 1042   HGBUR LARGE (A) 06/06/2018 1042   BILIRUBINUR NEGATIVE 06/06/2018 1042   KETONESUR NEGATIVE 06/06/2018 1042   PROTEINUR 100 (A) 06/06/2018 1042   NITRITE NEGATIVE 06/06/2018 1042   LEUKOCYTESUR NEGATIVE 06/06/2018 1042    Recent Results (from the past 240 hour(s))  Blood Culture (routine x 2)     Status: None (Preliminary result)   Collection Time: 06/04/18  9:57 AM  Result Value Ref  Range Status   Specimen Description   Final    BLOOD RIGHT HAND Performed at [facility] [address], [place], [place] [address]    Special Requests   Final    BOTTLES DRAWN AEROBIC ONLY Blood Culture adequate volume Performed at [facility] [address] [place], [place] [address]    Culture   Final    NO GROWTH 4 DAYS Performed at [facility], [place] [address], [place], [place] [address]    Report Status PENDING  Incomplete  Blood Culture (routine x 2)     Status: None (Preliminary result)   Collection Time: 06/04/18 10:00 AM  Result Value Ref Range Status   Specimen Description   Final    BLOOD LEFT HAND Performed at [facility] [address] [place], [place] [address]     Special Requests   Final    BOTTLES DRAWN AEROBIC AND ANAEROBIC Blood Culture results may not be optimal due to an inadequate volume of blood received in culture bottles Performed at [facility] [address], [place], [place] [address]    Culture   Final    NO GROWTH 4 DAYS Performed at [facility], [place] [address], [place], [place] [address]    Report Status PENDING  Incomplete  SARS Coronavirus 2 (CEPHEID - Performed in [facility] hospital lab), Hosp Order     Status: None   Collection Time: 06/04/18 10:00 AM  Result Value Ref Range Status   SARS Coronavirus 2 NEGATIVE NEGATIVE Final    Comment: (NOTE) If result is NEGATIVE SARS-CoV-2 target nucleic acids are NOT DETECTED. The SARS-CoV-2 RNA is generally detectable in upper and lower  respiratory specimens during the acute phase of infection. The lowest  concentration of SARS-CoV-2 viral copies this assay can detect is 250  copies / mL. A negative result does not preclude SARS-CoV-2 infection  and should not be used as the sole basis for treatment or other  patient management decisions.  A negative result may occur with  improper specimen collection / handling, submission of specimen other  than nasopharyngeal swab, presence of viral mutation(s) within the  areas targeted by this assay, and inadequate number of viral copies  (<250 copies / mL). A negative result must be combined with clinical  observations, patient history, and epidemiological information. If result is POSITIVE SARS-CoV-2 target nucleic acids are DETECTED. The SARS-CoV-2 RNA is generally detectable in upper and lower  respiratory specimens dur ing the acute phase of infection.  Positive  results are indicative of active infection with SARS-CoV-2.  Clinical  correlation with patient history and other diagnostic information is  necessary to determine patient infection status.  Positive results do  not rule out bacterial infection or  co-infection with other viruses. If result is PRESUMPTIVE POSTIVE SARS-CoV-2 nucleic acids MAY BE PRESENT.   A presumptive positive result was obtained on the submitted specimen  and confirmed on repeat testing.  While 2019 novel coronavirus  (SARS-CoV-2) nucleic acids may be present in the submitted sample  additional confirmatory testing may be necessary for epidemiological  and / or clinical management purposes  to differentiate between  SARS-CoV-2 and other Sarbecovirus currently known to infect humans.  If clinically indicated additional testing with an alternate test  methodology [phone]) is advised. The SARS-CoV-2 RNA is generally  detectable in upper and lower respiratory sp ecimens during the acute  phase of infection. The expected result  is Negative. Fact Sheet for Patients:  StrictlyIdeas.no Fact Sheet for Healthcare Providers: BankingDealers.co.za This test is not yet approved or cleared by the [place] FDA and has been authorized for detection and/or diagnosis of SARS-CoV-2 by FDA under an Emergency Use Authorization (EUA).  This EUA will remain in effect (meaning this test can be used) for the duration of the COVID-19 declaration under Section 564(b)(1) of the Act, 21 U.S.C. section 360bbb-3(b)(1), unless the authorization is terminated or revoked sooner. Performed at [facility], [place] [address] [place], [place] [address]   Urine Culture     Status: None   Collection Time: 06/06/18 10:42 AM  Result Value Ref Range Status   Specimen Description URINE, CATHETERIZED  Final   Special Requests NONE  Final   Culture   Final    NO GROWTH Performed at [facility], [address] [place], [place] [address]    Report Status 06/07/2018 FINAL  Final  MRSA PCR Screening     Status: None   Collection Time: 06/06/18  1:50 PM  Result Value Ref Range Status   MRSA by PCR NEGATIVE NEGATIVE Final     Comment:        The GeneXpert MRSA Assay (FDA approved for NASAL specimens only), is one component of a comprehensive MRSA colonization surveillance program. It is not intended to diagnose MRSA infection nor to guide or monitor treatment for MRSA infections. Performed at [facility], [place] [address], [place],  [address]      Radiology Studies: No results found.  Scheduled Meds: . aspirin  81 mg Oral Daily  . Chlorhexidine Gluconate Cloth  6 each Topical Q0600  . divalproex  125 mg Oral QHS  . ferrous sulfate  325 mg Oral Q breakfast  . fluticasone  1 spray Each Nare Daily  . heparin  5,000 Units Subcutaneous Q8H  . insulin aspart  0-9 Units Subcutaneous Q4H  . potassium chloride  20 mEq Oral Daily  . traZODone  150 mg Oral QHS  . ziprasidone  60 mg Oral Daily   Continuous Infusions: . sodium chloride 150 mL/hr at 06/08/18 0333  . sodium chloride       LOS: 4 days    Time spent: 55 minutes.  More than 50% of the time dedicated to face-to-face examination; discussion about acute illness and planning/coordination of care.  Patient case discussed with nephrologist service and nursing staff has been updated.  At this moment is still not making a lot of urine, but is not requiring emergent dialysis. Has been corrected and creatinine continues trending up.  Her mentation is back to baseline.  No signs of acute infection appreciated and has been off antibiotics for 36 hours.   Barton Dubois, MD [facility] Pager [phone]  06/08/2018, 8:19 AM

## 2018-06-08 NOTE — NC FL2 (Signed)
[place] MEDICAID FL2 LEVEL OF CARE SCREENING TOOL     IDENTIFICATION  Patient Name: Chad Mathis Birthdate: May 01, 1955 Sex: male Admission Date (Current Location): 06/04/2018  [facility] and [place] Number:  Herbalist and Address:  [facility] [place], [place] [address] [place], [place] [address]      Provider Number: [id number]  Attending Physician Name and Address:  Barton Dubois, MD  Relative Name and Phone Number:  Delfino Lovett, [phone]    Current Level of Care: Hospital Recommended Level of Care: [place] Prior Approval Number:    Date Approved/Denied:   PASRR Number:    Discharge Plan: SNF    Current Diagnoses: Patient Active Problem List   Diagnosis Date Noted  . Acute renal failure ([place]) 06/04/2018  . SIRS (systemic inflammatory response syndrome) ([place]) 06/04/2018  . Onychomycosis 02/26/2013  . Pain in lower limb 02/26/2013  . Diabetes mellitus ([place]) 02/26/2013    Orientation RESPIRATION BLADDER Height & Weight     Self, Situation, Place  Normal Incontinent, Indwelling catheter Weight: 125 lb (56.7 kg) Height:     BEHAVIORAL SYMPTOMS/MOOD NEUROLOGICAL BOWEL NUTRITION STATUS      Continent Diet(Please see DC Summary)  AMBULATORY STATUS COMMUNICATION OF NEEDS Skin   Extensive Assist Verbally Normal                       Personal Care Assistance Level of Assistance  Bathing, Feeding, Dressing Bathing Assistance: Maximum assistance Feeding assistance: Limited assistance Dressing Assistance: Limited assistance     Functional Limitations Info  Sight, Hearing, Speech Sight Info: Impaired Hearing Info: Adequate Speech Info: Adequate    SPECIAL CARE FACTORS FREQUENCY  PT (By licensed PT), OT (By licensed OT)     PT Frequency: 5x/week OT Frequency: 3x/week            Contractures Contractures Info: Not present    Additional Factors Info  Code Status, Allergies, Psychotropic, Insulin  Sliding Scale Code Status Info: Full Allergies Info: NKA Psychotropic Info: Depakote;Trazadone;Giodon Insulin Sliding Scale Info: See dc summary for dose       Current Medications (06/08/2018):  This is the current hospital active medication list Current Facility-Administered Medications  Medication Dose Route Frequency Provider Last Rate Last Dose  . acetaminophen (TYLENOL) tablet 650 mg  650 mg Oral Q6H PRN Mariel Aloe, MD       Or  . acetaminophen (TYLENOL) suppository 650 mg  650 mg Rectal Q6H PRN Mariel Aloe, MD      . aspirin chewable tablet 81 mg  81 mg Oral Daily Mariel Aloe, MD   81 mg at 06/08/18 0912  . Chlorhexidine Gluconate Cloth 2 % PADS 6 each  6 each Topical Q0600 Justin Mend, MD   6 each at 06/07/18 [phone]  . divalproex (DEPAKOTE) DR tablet 125 mg  125 mg Oral QHS Mariel Aloe, MD   125 mg at 06/07/18 2029  . ferrous sulfate tablet 325 mg  325 mg Oral Q breakfast Mariel Aloe, MD   325 mg at 06/08/18 0800  . fluticasone (FLONASE) 50 MCG/ACT nasal spray 1 spray  1 spray Each Nare Daily Mariel Aloe, MD   1 spray at 06/08/18 0912  . heparin injection 5,000 Units  5,000 Units Subcutaneous Q8H Mariel Aloe, MD   5,000 Units at 06/08/18 [phone]  . insulin aspart (novoLOG) injection 0-9 Units  0-9 Units Subcutaneous Q4H Cordelia Poche  A, MD   1 Units at 06/06/18 2038  . ondansetron (ZOFRAN) injection 4 mg  4 mg Intravenous Q6H PRN Mariel Aloe, MD      . sodium chloride 0.9 % bolus 500 mL  500 mL Intravenous Once Justin Mend, MD      . temazepam (RESTORIL) capsule 7.5 mg  7.5 mg Oral QHS PRN Barton Dubois, MD      . traZODone (DESYREL) tablet 150 mg  150 mg Oral QHS Mariel Aloe, MD   150 mg at 06/07/18 2029  . ziprasidone (GEODON) capsule 60 mg  60 mg Oral Daily Mariel Aloe, MD   60 mg at 06/07/18 1751     Discharge Medications: Please see discharge summary for a list of discharge medications.  Relevant Imaging Results:  Relevant  Lab Results:   Additional Information SSN: [id number]   COVID negative on 5/28  Benard Halsted, LCSW

## 2018-06-08 NOTE — Progress Notes (Signed)
Physical Therapy Treatment Patient Details Name: Chad Mathis MRN: [id number] DOB: 1955-05-11 Today's Date: 06/08/2018    History of Present Illness Pt is a 63 y.o. male admitted from ALF on 06/04/18 with nausea, vomiting and progressive weakness. Worked up for AKI and SIRS. PMH includes legally blind, bipolar disorder, DM2, asthma.    PT Comments    Patient required continues to require assist for all transfers but he had improved stability. He was able to use the walker to transfer today. He would benefit from continued acute therapy for mobility and endurance , as well as rehab at a SNF.    Follow Up Recommendations  SNF     Equipment Recommendations  Rolling walker with 5" wheels    Recommendations for Other Services Rehab consult     Precautions / Restrictions Precautions Precautions: Fall Precaution Comments: legally blind  Restrictions Weight Bearing Restrictions: No    Mobility  Bed Mobility Overal bed mobility: Needs Assistance Bed Mobility: Sit to Supine       Sit to supine: Mod assist   General bed mobility comments: continues to require mod a to sit up at the edge of bed. The patient only required supervision to sit at the edge of the bed, but after several minutes therapy attmpted to untangle a wire and the patient fell back in bed. He did not hit anything. He did not know why he suddenlty lost balance   Transfers Overall transfer level: Needs assistance   Transfers: Sit to/from Stand Sit to Stand: Min assist         General transfer comment: Min a for strangth and balance to stand. Likely could have stood with walker.   Ambulation/Gait Ambulation/Gait assistance: Min assist;+2 physical assistance Gait Distance (Feet): 4 Feet Assistive device: Rolling walker (2 wheeled) Gait Pattern/deviations: Step-through pattern Gait velocity: decreased   General Gait Details: slow but steady gait with walker today. Patient continues to require min a for  balance    Stairs             Wheelchair Mobility    Modified Rankin (Stroke Patients Only)       Balance Overall balance assessment: Needs assistance Sitting-balance support: Bilateral upper extremity supported Sitting balance-Leahy Scale: Poor Sitting balance - Comments: lost balance at one point    Standing balance support: Bilateral upper extremity supported Standing balance-Leahy Scale: Poor                              Cognition Arousal/Alertness: Awake/alert Behavior During Therapy: WFL for tasks assessed/performed Overall Cognitive Status: Within Functional Limits for tasks assessed                                 General Comments: Patient is baseline bipolar but was very pleasent during treatment      Exercises      General Comments        Pertinent Vitals/Pain Pain Assessment: No/denies pain    Home Living                      Prior Function            PT Goals (current goals can now be found in the care plan section) Acute Rehab PT Goals Patient Stated Goal: to get out of bed   PT Goal Formulation: With patient Time For Goal  Achievement: 06/12/18 Potential to Achieve Goals: Good    Frequency    Min 2X/week      PT Plan Current plan remains appropriate    Co-evaluation              AM-PAC PT "6 Clicks" Mobility   Outcome Measure  Help needed turning from your back to your side while in a flat bed without using bedrails?: A Lot Help needed moving from lying on your back to sitting on the side of a flat bed without using bedrails?: A Lot Help needed moving to and from a bed to a chair (including a wheelchair)?: A Little Help needed standing up from a chair using your arms (e.g., wheelchair or bedside chair)?: A Little Help needed to walk in hospital room?: A Lot Help needed climbing 3-5 steps with a railing? : Total 6 Click Score: 13    End of Session Equipment Utilized During  Treatment: Gait belt Activity Tolerance: Patient tolerated treatment well Patient left: in chair;with call bell/phone within reach;with chair alarm set Nurse Communication: Mobility status PT Visit Diagnosis: Unsteadiness on feet (R26.81);Muscle weakness (generalized) (M62.81)     Time: 3734-2876 PT Time Calculation (min) (ACUTE ONLY): 17 min  Charges:  $Therapeutic Activity: 8-22 mins                      Carney Living PT DPT  06/08/2018, 4:26 PM

## 2018-06-08 NOTE — Progress Notes (Signed)
Patient ID: VENSON FERENCZ, male   DOB: 1955/02/24, 63 y.o.   MRN: [id number] S: only 150 of UOP recorded -still has foley- sugar is low, asking for juice- seems alert and not uremic- CK is increasing    O:BP 138/77 (BP Location: Left Arm)   Pulse 94   Temp 97.8 F (36.6 C) (Oral)   Resp (!) 21   Wt 56.7 kg   SpO2 95%   BMI 22.14 kg/m   Intake/Output Summary (Last 24 hours) at 06/08/2018 1242 Last data filed at 06/08/2018 0917 Gross per 24 hour  Intake 2492.04 ml  Output -  Net 2492.04 ml   Intake/Output: I/O last 3 completed shifts: In: 2000 [P.O.:500; I.V.:1500] Out: 150 [Urine:150]  Intake/Output this shift:  Total I/O In: 492 [P.O.:150; I.V.:342] Out: -  Weight change:  Gen:NAD CVS: no rub  Resp: cta Abd: benign Ext: dep pitting edema  Recent Labs  Lab 06/04/18 0957 06/04/18 1504 06/05/18 0715 06/06/18 0316 06/07/18 0349 06/08/18 0403  NA 133* 136 139 139 140 136  K 7.0* 6.3* 3.2* 2.9* 2.9* 3.5  CL 97* 99 92* 98 105 107  CO2 <7* 9* 26 24 22  17*  GLUCOSE 66* 206* 106* 105* 80 80  BUN 88* 84* 26* 30* 33* 33*  CREATININE 8.86* 8.35* 4.87* 5.73* 6.48* 7.15*  ALBUMIN 3.8  --  2.7* 2.5* 2.2* 2.2*  CALCIUM 8.1* 7.4* 8.7* 8.0* 7.7* 8.0*  AST 262*  --  199* 213* 215* 242*  ALT 184*  --  157* 162* 161* 193*   Liver Function Tests: Recent Labs  Lab 06/06/18 0316 06/07/18 0349 06/08/18 0403  AST 213* 215* 242*  ALT 162* 161* 193*  ALKPHOS 31* 30* 32*  BILITOT 0.7 0.6 0.7  PROT 4.9* 4.5* 4.3*  ALBUMIN 2.5* 2.2* 2.2*   No results for input(s): LIPASE, AMYLASE in the last 168 hours. No results for input(s): AMMONIA in the last 168 hours. CBC: Recent Labs  Lab 06/04/18 0957 06/05/18 0715 06/06/18 0316  WBC 13.5* 7.0 6.7  NEUTROABS 12.0*  --   --   HGB 12.1* 10.0* 9.8*  HCT 39.4 30.1* 28.5*  MCV 81.9 74.7* 75.4*  PLT 242 205 160   Cardiac Enzymes: Recent Labs  Lab 06/05/18 1207 06/06/18 0316 06/07/18 0349 06/08/18 0403  CKTOTAL 12,583* 6,870*  9,870* 11,726*   CBG: Recent Labs  Lab 06/08/18 0023 06/08/18 0644 06/08/18 0749 06/08/18 0842 06/08/18 1147  GLUCAP 73 71 69* 96 85    Iron Studies:  No results for input(s): IRON, TIBC, TRANSFERRIN, FERRITIN in the last 72 hours. Studies/Results: No results found. Marland Kitchen aspirin  81 mg Oral Daily  . Chlorhexidine Gluconate Cloth  6 each Topical Q0600  . divalproex  125 mg Oral QHS  . ferrous sulfate  325 mg Oral Q breakfast  . fluticasone  1 spray Each Nare Daily  . heparin  5,000 Units Subcutaneous Q8H  . insulin aspart  0-9 Units Subcutaneous Q4H  . traZODone  150 mg Oral QHS  . ziprasidone  60 mg Oral Daily    BMET    Component Value Date/Time   NA 136 06/08/2018 0403   K 3.5 06/08/2018 0403   CL 107 06/08/2018 0403   CO2 17 (L) 06/08/2018 0403   GLUCOSE 80 06/08/2018 0403   BUN 33 (H) 06/08/2018 0403   CREATININE 7.15 (H) 06/08/2018 0403   CALCIUM 8.0 (L) 06/08/2018 0403   GFRNONAA 7 (L) 06/08/2018 0403   GFRAA 9 (L)  06/08/2018 0403   CBC    Component Value Date/Time   WBC 6.7 06/06/2018 0316   RBC 3.78 (L) 06/06/2018 0316   HGB 9.8 (L) 06/06/2018 0316   HCT 28.5 (L) 06/06/2018 0316   PLT 160 06/06/2018 0316   MCV 75.4 (L) 06/06/2018 0316   MCH 25.9 (L) 06/06/2018 0316   MCHC 34.4 06/06/2018 0316   RDW 13.2 06/06/2018 0316   LYMPHSABS 0.6 (L) 06/04/2018 0957   MONOABS 0.8 06/04/2018 0957   EOSABS 0.1 06/04/2018 0957   BASOSABS 0.0 06/04/2018 0957    Assessment/Plan:  1. AKI/CKD stage 3 (last known Cr was 1.42 in Dec 2017). Oliguric presumably multifactorial; volume depletion due to N/V in setting of concomitant ACE inhibition and diuretics with superimposed rhabdomyolysis. s/p urgent HD on 06/04/18 due to hyperkalemia. Receiving IVF's but remains oliguric. [place] without hydro but did show increased echogenicity consistent with chronic medical renal disease. 1. Now with foley cath in place 2. Continue to hold benazepril, hctz, and glucophage 3. Avoid  nephrotoxic agents 4. Switch ivfs to stopping now as I feel tank is full even though CK is still high - give lasix  5. UOP improving s/p foley but remains oliguric 6. Temp HD cath placed 06/04/18 7. No indication for dialysis at this time but may need again soon via vascath placed 5/28.  Continue to follow closely 2. Metabolic acidosis- due to #1 and concomitant metformin. Improved with isotonic bicarb- drifting down again , will follow  3. Rhabdomyolysis- likely contributing to AKI but unclear etiology other than statin therapy. No history of falls or being found down.  follow levels. 4. Hyperkalemia- improved with HD, lokelma, and IV bicarb 1. Now hypokalemic.  2. Replete and follow 5. Anemia- presumably due to CKD, however unknown baseline Cr. Will check SPEP/UPEP, iron stores and follow. Would also recommend guaiac stools 6. SIRS- on zosyn and cultures pending. covid-19 negative  7. DM- per primary svc 8. HTN- low BP's on arrival, meds on hold 9. Elevated AST/ALT- likely due to shock liver but r/o infectious and agree with holding statin in setting of rhabdo.  Chad Mathis

## 2018-06-08 NOTE — TOC Initial Note (Signed)
Transition of Care [facility]) - Initial/Assessment Note    Patient Details  Name: Chad Mathis MRN: [id number] Date of Birth: [birth year]  [facility]) CM/SW Contact:    Benard Halsted, LCSW Phone Number: 06/08/2018, 12:55 PM  Clinical Narrative:                 CSW received consult for possible SNF placement at time of discharge. CSW spoke with patient's legal guardian, [name], regarding PT recommendation of SNF placement at time of discharge. [name] reported that patient's care home ([facility]) is currently unable to care for patient anymore given patient's current physical needs and fall risk. Patient and [name] expressed understanding of PT recommendation and are agreeable to SNF placement at time of discharge. Patient reports preference for [facility] since [place] has COVID cases. CSW discussed insurance authorization process and provided Medicare SNF ratings list. [name] expressed being hopeful for rehab and for patient to feel better soon. No further questions reported at this time. CSW to continue to follow and assist with discharge planning needs.   Expected Discharge Plan: Skilled Nursing Facility Barriers to Discharge: No Barriers Identified   Patient Goals and CMS Choice Patient states their goals for this hospitalization and ongoing recovery are:: Rehab CMS Medicare.gov Compare Post Acute Care list provided to:: Legal Guardian Choice offered to / list presented to : [place] / Guardian  Expected Discharge Plan and Services Expected Discharge Plan: [place] In-house Referral: Clinical Social Work Discharge Planning Services: NA Post Acute Care Choice: [place] Living arrangements for the past 2 months: [place] Expected Discharge Date: (unknown)               DME Arranged: N/A DME Agency: NA       HH Arranged: NA HH Agency: NA        Prior Living Arrangements/Services Living arrangements for the past 2  months: [place] Lives with:: Facility Resident Patient language and need for interpreter reviewed:: No Do you feel safe going back to the place where you live?: Yes      Need for Family Participation in Patient Care: Yes (Comment) Care giver support system in place?: Yes (comment)   Criminal Activity/Legal Involvement Pertinent to Current Situation/Hospitalization: No - Comment as needed  Activities of Daily Living Home Assistive Devices/Equipment: Eyeglasses, CBG Meter ADL Screening (condition at time of admission) Patient's cognitive ability adequate to safely complete daily activities?: Yes Is the patient deaf or have difficulty hearing?: No Does the patient have difficulty seeing, even when wearing glasses/contacts?: Yes(patient legally blind) Does the patient have difficulty concentrating, remembering, or making decisions?: No Patient able to express need for assistance with ADLs?: Yes Does the patient have difficulty dressing or bathing?: Yes Independently performs ADLs?: No Communication: Independent Dressing (OT): Needs assistance Is this a change from baseline?: Pre-admission baseline Grooming: Needs assistance Is this a change from baseline?: Pre-admission baseline Feeding: Needs assistance Is this a change from baseline?: Pre-admission baseline Toileting: Independent In/Out Bed: Independent Walks in Home: Independent Does the patient have difficulty walking or climbing stairs?: No Weakness of Legs: Both Weakness of Arms/Hands: None  Permission Sought/Granted Permission sought to share information with : Guardian Permission granted to share information with : Yes, Release of Information Signed           Permission granted to share info w Contact Information: [place]  Emotional Assessment Appearance:: Appears stated age Attitude/Demeanor/Rapport: Unable to Assess Affect (typically observed):  Unable to Assess Orientation: :  Oriented to Self, Oriented to Place Alcohol / Substance Use: Not Applicable Psych Involvement: No (comment)  Admission diagnosis:  Acute hyperkalemia [E87.5] Lactic acidosis [E87.2] SIRS (systemic inflammatory response syndrome) (HCC) [R65.10] Acute renal failure, unspecified acute renal failure type [facility]) [N17.9] Patient Active Problem List   Diagnosis Date Noted  . Acute renal failure ([place]) 06/04/2018  . SIRS (systemic inflammatory response syndrome) ([place]) 06/04/2018  . Onychomycosis 02/26/2013  . Pain in lower limb 02/26/2013  . Diabetes mellitus ([place]) 02/26/2013   PCP:  Verner Chol, MD Pharmacy:  No Pharmacies Listed    Social Determinants of Health (SDOH) Interventions    Readmission Risk Interventions No flowsheet data found.

## 2018-06-08 NOTE — Care Management Important Message (Signed)
Important Message  Patient Details  Name: Chad Mathis MRN: [id number] Date of Birth: [birth year]   Medicare Important Message Given:  Yes    Orbie Pyo 06/08/2018, 3:26 PM

## 2018-06-08 NOTE — Progress Notes (Signed)
Patient's CBG this AM was 69. Patient alert and oriented, no complaints, no acute distress noted; after 240 ml apple juice CBG came up to 96. Will continue to monitor.

## 2018-06-09 DIAGNOSIS — E872 Acidosis: Secondary | ICD-10-CM

## 2018-06-09 LAB — RENAL FUNCTION PANEL
Albumin: 2.3 g/dL — ABNORMAL LOW (ref 3.5–5.0)
Anion gap: 14 (ref 5–15)
BUN: 38 mg/dL — ABNORMAL HIGH (ref 8–23)
CO2: 17 mmol/L — ABNORMAL LOW (ref 22–32)
Calcium: 8.5 mg/dL — ABNORMAL LOW (ref 8.9–10.3)
Chloride: 108 mmol/L (ref 98–111)
Creatinine, Ser: 7.9 mg/dL — ABNORMAL HIGH (ref 0.61–1.24)
GFR calc Af Amer: 8 mL/min — ABNORMAL LOW (ref >60)
GFR calc non Af Amer: 7 mL/min — ABNORMAL LOW (ref >60)
Glucose, Bld: 95 mg/dL (ref 70–99)
Phosphorus: 5.5 mg/dL — ABNORMAL HIGH (ref 2.5–4.6)
Potassium: 3.9 mmol/L (ref 3.5–5.1)
Sodium: 139 mmol/L (ref 135–145)

## 2018-06-09 LAB — GLUCOSE, CAPILLARY
Glucose-Capillary: 103 mg/dL — ABNORMAL HIGH (ref 70–99)
Glucose-Capillary: 80 mg/dL (ref 70–99)
Glucose-Capillary: 86 mg/dL (ref 70–99)
Glucose-Capillary: 87 mg/dL (ref 70–99)
Glucose-Capillary: 96 mg/dL (ref 70–99)
Glucose-Capillary: 97 mg/dL (ref 70–99)

## 2018-06-09 LAB — CULTURE, BLOOD (ROUTINE X 2)
Culture: NO GROWTH
Culture: NO GROWTH
Special Requests: ADEQUATE

## 2018-06-09 MED ORDER — SODIUM BICARBONATE 650 MG PO TABS
650.0000 mg | ORAL_TABLET | Freq: Two times a day (BID) | ORAL | Status: DC
  Administered 2018-06-09 – 2018-06-16 (×15): 650 mg via ORAL
  Filled 2018-06-09 (×15): qty 1

## 2018-06-09 NOTE — Progress Notes (Signed)
PROGRESS NOTE    Chad Mathis  GYJ:[id number] DOB: 1955/03/18 DOA: 06/04/2018 PCP: Verner Chol, MD     Brief Narrative:  63 y.o. male with medical history significant of diabetes, bipolar disorder.  Patient reports nausea and vomiting for the last 3 days.  The symptoms have worsened.  He has not been able to tolerate a diet.  He has not taken anything to help with the symptoms.  No associated chest pain, shortness of breath, abdominal pain, diarrhea. He reports decreased urine output since Monday.   Assessment & Plan: Acute on chronic renal failure: Stage III at baseline secondary to diabetes most likely -Patient currently oliguric -Received emergent hemodialysis on 06/04/2018 -No need for hemodialysis at this point; but might require it soon  -Creatinine up to 7.9 -Mild metabolic acidosis present, CO2 17 -continue bicarb BID -Electrolytes stable currently -Continue IV fluids -Follow renal function trend and recovery in his ability to make urine -Follow nephrology recommendations. -Continue avoiding nephrotoxic agents.  SIRS -Currently afebrile and with normal wBC's -COVID-19 -Continue to follow final culture results (no growth or microorganism isolated) -Continue monitoring off antibiotics -Very possible that his SIRS features were secondary to dehydration, acute renal failure and uremia.  Nausea/vomiting -Secondary to uremia versus underlying viral gastroenteritis -Continue as needed antiemetics -Continue IV fluids -Advance diet as tolerated.  Hyperkalemia/hypokalemia -In the setting of acute renal failure -Improved and resolved after hemodialysis -Patient also receive bicarbonate, Lokelma and calcium gluconate at time of admission. -Potassium remains stable, now 3.9 -Continue to follow electrolytes and further replete as needed.     Type II diabetes mellitus with nephropathy -Continue sliding scale insulin -Follow CBGs and adjust hypoglycemic regimen as  needed -Continue holding oral hypoglycemic agents.  Bipolar disorder/schizophrenia -Continue Depakote and Geodon -No suicidal ideation or hallucinations currently.  Elevated transaminitis -No prior history of liver disease -Continue holding statins -Follow LFTs trend -Most likely associated with shock liver and rhabdomyolysis. -CK still up.  Essential HTN -BP stable currently (borderline low) -continue holding antihypertensive agents)  Urinary retention -Continue the use of Foley catheter. -Follow I's and O's  Insomnia -will continue the use of Restoril as needed.   DVT prophylaxis: Heparin Code Status: Full code Family Communication: No family at bedside Disposition Plan: Continue IV fluids, follow renal function and electrolytes trend, follow urine output, follow nephrology recommendations.  Consultants:   Nephrology service  Procedures:   See below for x-ray reports.  Antimicrobials:  Anti-infectives (From admission, onward)   Start     Dose/Rate Route Frequency Ordered Stop   06/04/18 2200  piperacillin-tazobactam (ZOSYN) IVPB 2.25 g  Status:  Discontinued     2.25 g 100 mL/hr over 30 Minutes Intravenous Every 8 hours 06/04/18 1250 06/06/18 1846   06/04/18 1300  piperacillin-tazobactam (ZOSYN) IVPB 3.375 g     3.375 g 100 mL/hr over 30 Minutes Intravenous  Once 06/04/18 1250 06/04/18 1432   06/04/18 1100  cefTRIAXone (ROCEPHIN) 1 g in sodium chloride 0.9 % 100 mL IVPB     1 g 200 mL/hr over 30 Minutes Intravenous  Once 06/04/18 1054 06/04/18 1215      Subjective: Still oliguric; no fever, no chest pain, no nausea, no vomiting.  No shortness of breath.  Objective: Vitals:   06/09/18 0755 06/09/18 1155 06/09/18 1217 06/09/18 1616  BP: 122/67 130/81    Pulse: 94     Resp: (!) 21 (!) 21    Temp: 97.6 F (36.4 C)  98.1 F (36.7 C) 98.6 F (  37 C)  TempSrc: Oral  Oral Oral  SpO2: 98% 97%    Weight:        Intake/Output Summary (Last 24 hours) at  06/09/2018 1623 Last data filed at 06/09/2018 1000 Gross per 24 hour  Intake 250 ml  Output -  Net 250 ml   Filed Weights   06/04/18 1715 06/04/18 2006 06/04/18 2056  Weight: 56.4 kg 56.7 kg 56.7 kg    Examination: General exam: Alert, awake, oriented x 3; no nausea, no vomiting; denies chest pain or shortness of breath.  Still oliguric. Respiratory system: Clear to auscultation. Respiratory effort normal. Cardiovascular system:RRR. No murmurs, rubs, gallops. Gastrointestinal system: Abdomen is nondistended, soft and nontender. No organomegaly or masses felt. Normal bowel sounds heard. Central nervous system: Alert and oriented. No focal neurological deficits. Extremities: No C/C/E, +pedal pulses Skin: No rashes, lesions or ulcers Psychiatry: Judgement and insight appear normal. Mood & affect appropriate.    Data Reviewed: I have personally reviewed following labs and imaging studies  CBC: Recent Labs  Lab 06/04/18 0957 06/05/18 0715 06/05/18 1207 06/06/18 0316  WBC 13.5* 7.0  --  6.7  NEUTROABS 12.0*  --   --   --   HGB 12.1* 10.0*  --  9.8*  HCT 39.4 30.1* 28.3* 28.5*  MCV 81.9 74.7*  --  75.4*  PLT 242 205  --  631   Basic Metabolic Panel: Recent Labs  Lab 06/05/18 0715 06/06/18 0316 06/07/18 0349 06/08/18 0403 06/09/18 0606  NA 139 139 140 136 139  K 3.2* 2.9* 2.9* 3.5 3.9  CL 92* 98 105 107 108  CO2 26 24 22  17* 17*  GLUCOSE 106* 105* 80 80 95  BUN 26* 30* 33* 33* 38*  CREATININE 4.87* 5.73* 6.48* 7.15* 7.90*  CALCIUM 8.7* 8.0* 7.7* 8.0* 8.5*  PHOS  --   --   --   --  5.5*   GFR: CrCl cannot be calculated (Unknown ideal weight.).   Liver Function Tests: Recent Labs  Lab 06/04/18 0957 06/05/18 0715 06/06/18 0316 06/07/18 0349 06/08/18 0403 06/09/18 0606  AST 262* 199* 213* 215* 242*  --   ALT 184* 157* 162* 161* 193*  --   ALKPHOS 48 35* 31* 30* 32*  --   BILITOT 0.7 0.6 0.7 0.6 0.7  --   PROT 7.3 5.4* 4.9* 4.5* 4.3*  --   ALBUMIN 3.8 2.7*  2.5* 2.2* 2.2* 2.3*   Coagulation Profile: Recent Labs  Lab 06/04/18 1044  INR 1.1   Cardiac Enzymes: Recent Labs  Lab 06/05/18 1207 06/06/18 0316 06/07/18 0349 06/08/18 0403  CKTOTAL 12,583* 6,870* 9,870* 11,726*   CBG: Recent Labs  Lab 06/09/18 0024 06/09/18 0401 06/09/18 0803 06/09/18 1216 06/09/18 1614  GLUCAP 103* 87 86 97 96   Urine analysis:    Component Value Date/Time   COLORURINE AMBER (A) 06/06/2018 1042   APPEARANCEUR HAZY (A) 06/06/2018 1042   LABSPEC 1.020 06/06/2018 1042   PHURINE 5.0 06/06/2018 1042   GLUCOSEU 50 (A) 06/06/2018 1042   HGBUR LARGE (A) 06/06/2018 1042   BILIRUBINUR NEGATIVE 06/06/2018 1042   KETONESUR NEGATIVE 06/06/2018 1042   PROTEINUR 100 (A) 06/06/2018 1042   NITRITE NEGATIVE 06/06/2018 1042   LEUKOCYTESUR NEGATIVE 06/06/2018 1042    Recent Results (from the past 240 hour(s))  Blood Culture (routine x 2)     Status: None   Collection Time: 06/04/18  9:57 AM  Result Value Ref Range Status   Specimen Description  Final    BLOOD RIGHT HAND Performed at [facility], [place] [address], [place], [place] [address]    Special Requests   Final    BOTTLES DRAWN AEROBIC ONLY Blood Culture adequate volume Performed at [place] [address] [place], [place], [place] [address]    Culture   Final    NO GROWTH 5 DAYS Performed at [facility], [place] [address] [place], [place] [address]    Report Status 06/09/2018 FINAL  Final  Blood Culture (routine x 2)     Status: None   Collection Time: 06/04/18 10:00 AM  Result Value Ref Range Status   Specimen Description   Final    BLOOD LEFT HAND Performed at [place] [address] [place], [place] [address]    Special Requests   Final    BOTTLES DRAWN AEROBIC AND ANAEROBIC Blood Culture results may not be optimal due to an inadequate volume of blood received in culture bottles Performed at [place] [address], [place], [place] [address]    Culture   Final    NO GROWTH 5 DAYS Performed at [facility], [place] [address], [place],  [address]    Report Status 06/09/2018 FINAL  Final  SARS Coronavirus 2 (CEPHEID - Performed in [place] hospital lab), Hosp Order     Status: None   Collection Time: 06/04/18 10:00 AM  Result Value Ref Range Status   SARS Coronavirus 2 NEGATIVE NEGATIVE Final    Comment: (NOTE) If result is NEGATIVE SARS-CoV-2 target nucleic acids are NOT DETECTED. The SARS-CoV-2 RNA is generally detectable in upper and lower  respiratory specimens during the acute phase of infection. The lowest  concentration of SARS-CoV-2 viral copies this assay can detect is 250  copies / mL. A negative result does not preclude SARS-CoV-2 infection  and should not be used as the sole basis for treatment or other  patient management decisions.  A negative result may occur with  improper specimen collection / handling, submission of specimen other  than nasopharyngeal swab, presence of viral mutation(s) within the  areas targeted by this assay, and inadequate number of viral copies  (<250 copies / mL). A negative result must be combined with clinical  observations, patient history, and epidemiological information. If result is POSITIVE SARS-CoV-2 target nucleic acids are DETECTED. The SARS-CoV-2 RNA is generally detectable in upper and lower  respiratory specimens dur ing the acute phase of infection.  Positive  results are indicative of active infection with SARS-CoV-2.  Clinical  correlation with patient history and other diagnostic information is  necessary to determine patient infection status.  Positive results do  not rule out bacterial infection or co-infection with other viruses. If result is PRESUMPTIVE POSTIVE SARS-CoV-2 nucleic acids MAY BE PRESENT.   A presumptive positive result was obtained on the submitted specimen  and confirmed on repeat testing.   While 2019 novel coronavirus  (SARS-CoV-2) nucleic acids may be present in the submitted sample  additional confirmatory testing may be necessary for epidemiological  and / or clinical management purposes  to differentiate between  SARS-CoV-2 and other Sarbecovirus currently known to infect humans.  If clinically indicated additional testing with an alternate test  methodology [id number]) is advised. The SARS-CoV-2 RNA is generally  detectable in upper and lower respiratory sp ecimens during the acute  phase of infection. The expected result is Negative. Fact Sheet for Patients:  StrictlyIdeas.no  Fact Sheet for Healthcare Providers: BankingDealers.co.za This test is not yet approved or cleared by the [place] FDA and has been authorized for detection and/or diagnosis of SARS-CoV-2 by FDA under an Emergency Use Authorization (EUA).  This EUA will remain in effect (meaning this test can be used) for the duration of the COVID-19 declaration under Section 564(b)(1) of the Act, 21 U.S.C. section 360bbb-3(b)(1), unless the authorization is terminated or revoked sooner. Performed at [facility], [place] [address] [place], [place] [address]   Urine Culture     Status: None   Collection Time: 06/06/18 10:42 AM  Result Value Ref Range Status   Specimen Description URINE, CATHETERIZED  Final   Special Requests NONE  Final   Culture   Final    NO GROWTH Performed at [facility], [address] [place], [place] [address]    Report Status 06/07/2018 FINAL  Final  MRSA PCR Screening     Status: None   Collection Time: 06/06/18  1:50 PM  Result Value Ref Range Status   MRSA by PCR NEGATIVE NEGATIVE Final    Comment:        The GeneXpert MRSA Assay (FDA approved for NASAL specimens only), is one component of a comprehensive MRSA colonization surveillance program. It is not intended to diagnose MRSA infection nor  to guide or monitor treatment for MRSA infections. Performed at [facility], [place] [address], [place], [place] [address]      Radiology Studies: No results found.  Scheduled Meds: . aspirin  81 mg Oral Daily  . Chlorhexidine Gluconate Cloth  6 each Topical Q0600  . divalproex  125 mg Oral QHS  . ferrous sulfate  325 mg Oral Q breakfast  . fluticasone  1 spray Each Nare Daily  . furosemide  80 mg Intravenous Q12H  . heparin  5,000 Units Subcutaneous Q8H  . insulin aspart  0-9 Units Subcutaneous Q4H  . sodium bicarbonate  650 mg Oral BID  . traZODone  150 mg Oral QHS  . ziprasidone  60 mg Oral Daily   Continuous Infusions: . sodium chloride       LOS: 5 days    Time spent: 30 minutes   Barton Dubois, MD [facility] Pager [phone]  06/09/2018, 4:23 PM

## 2018-06-09 NOTE — TOC Progression Note (Signed)
Transition of Care [facility]) - Progression Note    Patient Details  Name: Chad Mathis MRN: [id number] Date of Birth: 26-Apr-1955  Transition of [facility]) CM/SW [place], LCSW Phone Number: 06/09/2018, 10:17 AM  Clinical Narrative:    Pasrr received: [phone] [id number]    Expected Discharge Plan: [place] Barriers to Discharge: No Barriers Identified  Expected Discharge Plan and Services Expected Discharge Plan: [place] In-house Referral: Clinical Social Work Discharge Planning Services: NA Post Acute Care Choice: [place] Living arrangements for the past 2 months: [place] Expected Discharge Date: (unknown)               DME Arranged: N/A DME Agency: NA       HH Arranged: NA HH Agency: NA         Social Determinants of Health (SDOH) Interventions    Readmission Risk Interventions No flowsheet data found.

## 2018-06-09 NOTE — Progress Notes (Signed)
Patient ID: Chad Mathis, male   DOB: [birth year], 63 y.o.   MRN: [id number] S: noUOP recorded -still has foley-- seems alert and not uremic- CK is increasing    O:BP 122/67 (BP Location: Left Arm)   Pulse 94   Temp 97.6 F (36.4 C) (Oral)   Resp (!) 21   Wt 56.7 kg   SpO2 98%   BMI 22.14 kg/m   Intake/Output Summary (Last 24 hours) at 06/09/2018 1120 Last data filed at 06/09/2018 1751 Gross per 24 hour  Intake 707.8 ml  Output -  Net 707.8 ml   Intake/Output: I/O last 3 completed shifts: In: 2609.8 [P.O.:210; I.V.:2399.8] Out: -   Intake/Output this shift:  Total I/O In: 90 [P.O.:90] Out: -  Weight change:  Gen:NAD CVS: no rub  Resp: cta Abd: benign Ext: dep pitting edema  Recent Labs  Lab 06/04/18 0957 06/04/18 1504 06/05/18 0715 06/06/18 0316 06/07/18 0349 06/08/18 0403 06/09/18 0606  NA 133* 136 139 139 140 136 139  K 7.0* 6.3* 3.2* 2.9* 2.9* 3.5 3.9  CL 97* 99 92* 98 105 107 108  CO2 <7* 9* 26 24 22  17* 17*  GLUCOSE 66* 206* 106* 105* 80 80 95  BUN 88* 84* 26* 30* 33* 33* 38*  CREATININE 8.86* 8.35* 4.87* 5.73* 6.48* 7.15* 7.90*  ALBUMIN 3.8  --  2.7* 2.5* 2.2* 2.2* 2.3*  CALCIUM 8.1* 7.4* 8.7* 8.0* 7.7* 8.0* 8.5*  PHOS  --   --   --   --   --   --  5.5*  AST 262*  --  199* 213* 215* 242*  --   ALT 184*  --  157* 162* 161* 193*  --    Liver Function Tests: Recent Labs  Lab 06/06/18 0316 06/07/18 0349 06/08/18 0403 06/09/18 0606  AST 213* 215* 242*  --   ALT 162* 161* 193*  --   ALKPHOS 31* 30* 32*  --   BILITOT 0.7 0.6 0.7  --   PROT 4.9* 4.5* 4.3*  --   ALBUMIN 2.5* 2.2* 2.2* 2.3*   No results for input(s): LIPASE, AMYLASE in the last 168 hours. No results for input(s): AMMONIA in the last 168 hours. CBC: Recent Labs  Lab 06/04/18 0957 06/05/18 0715 06/05/18 1207 06/06/18 0316  WBC 13.5* 7.0  --  6.7  NEUTROABS 12.0*  --   --   --   HGB 12.1* 10.0*  --  9.8*  HCT 39.4 30.1* 28.3* 28.5*  MCV 81.9 74.7*  --  75.4*  PLT 242 205  --   160   Cardiac Enzymes: Recent Labs  Lab 06/05/18 1207 06/06/18 0316 06/07/18 0349 06/08/18 0403  CKTOTAL 12,583* 6,870* 9,870* 11,726*   CBG: Recent Labs  Lab 06/08/18 1725 06/08/18 2023 06/09/18 0024 06/09/18 0401 06/09/18 0803  GLUCAP 93 86 103* 87 86    Iron Studies:  No results for input(s): IRON, TIBC, TRANSFERRIN, FERRITIN in the last 72 hours. Studies/Results: No results found. Marland Kitchen aspirin  81 mg Oral Daily  . Chlorhexidine Gluconate Cloth  6 each Topical Q0600  . divalproex  125 mg Oral QHS  . ferrous sulfate  325 mg Oral Q breakfast  . fluticasone  1 spray Each Nare Daily  . furosemide  80 mg Intravenous Q12H  . heparin  5,000 Units Subcutaneous Q8H  . insulin aspart  0-9 Units Subcutaneous Q4H  . sodium bicarbonate  650 mg Oral BID  . traZODone  150 mg Oral QHS  .  ziprasidone  60 mg Oral Daily    BMET    Component Value Date/Time   NA 139 06/09/2018 0606   K 3.9 06/09/2018 0606   CL 108 06/09/2018 0606   CO2 17 (L) 06/09/2018 0606   GLUCOSE 95 06/09/2018 0606   BUN 38 (H) 06/09/2018 0606   CREATININE 7.90 (H) 06/09/2018 0606   CALCIUM 8.5 (L) 06/09/2018 0606   GFRNONAA 7 (L) 06/09/2018 0606   GFRAA 8 (L) 06/09/2018 0606   CBC    Component Value Date/Time   WBC 6.7 06/06/2018 0316   RBC 3.78 (L) 06/06/2018 0316   HGB 9.8 (L) 06/06/2018 0316   HCT 28.5 (L) 06/06/2018 0316   HCT 28.3 (L) 06/05/2018 1207   PLT 160 06/06/2018 0316   MCV 75.4 (L) 06/06/2018 0316   MCH 25.9 (L) 06/06/2018 0316   MCHC 34.4 06/06/2018 0316   RDW 13.2 06/06/2018 0316   LYMPHSABS 0.6 (L) 06/04/2018 0957   MONOABS 0.8 06/04/2018 0957   EOSABS 0.1 06/04/2018 0957   BASOSABS 0.0 06/04/2018 0957    Assessment/Plan:  1. AKI/CKD stage 3 (last known Cr was 1.42 in Dec 2017). Oliguric presumably multifactorial; volume depletion due to N/V in setting of concomitant ACE inhibition and diuretics with superimposed rhabdomyolysis. s/p urgent HD on 06/04/18 due to  hyperkalemia. Seems to remains oliguric. [place] without hydro but did show increased echogenicity consistent with chronic medical renal disease. 1. Now with foley cath in place- minimal UOP 2. Continue to hold benazepril, hctz, and glucophage 3. Avoid nephrotoxic agents 4. Switch ivfs to stopping now as I feel tank is full even though CK is still high - give lasix with marginal response 5. UOP improving s/p foley but remains oliguric 6. Temp HD cath placed 06/04/18 7. No indication for dialysis at this time but may need again soon via vascath placed 5/28.  Continue to follow closely 2. Metabolic acidosis- due to #1 and concomitant metformin. Improved with isotonic bicarb- drifting down again , now oral bicarb 3. Rhabdomyolysis- likely contributing to AKI but unclear etiology other than statin therapy. No history of falls or being found down.  follow levels. 4. Hyperkalemia-Now hypokalemic.  1. Replete and follow 5. Anemia- presumably due to CKD, however unknown baseline Cr. Will check SPEP/UPEP, iron stores OK and follow.  6. SIRS- on zosyn and cultures pending. covid-19 negative  7. DM- per primary svc 8. HTN- low BP's on arrival, meds on hold 9. Elevated AST/ALT- likely due to shock liver but r/o infectious and agree with holding statin in setting of rhabdo.  Louis Meckel

## 2018-06-10 LAB — GLUCOSE, CAPILLARY
Glucose-Capillary: 104 mg/dL — ABNORMAL HIGH (ref 70–99)
Glucose-Capillary: 83 mg/dL (ref 70–99)
Glucose-Capillary: 88 mg/dL (ref 70–99)
Glucose-Capillary: 90 mg/dL (ref 70–99)
Glucose-Capillary: 90 mg/dL (ref 70–99)
Glucose-Capillary: 99 mg/dL (ref 70–99)

## 2018-06-10 LAB — RENAL FUNCTION PANEL
Albumin: 2.1 g/dL — ABNORMAL LOW (ref 3.5–5.0)
Anion gap: 13 (ref 5–15)
BUN: 43 mg/dL — ABNORMAL HIGH (ref 8–23)
CO2: 18 mmol/L — ABNORMAL LOW (ref 22–32)
Calcium: 8.3 mg/dL — ABNORMAL LOW (ref 8.9–10.3)
Chloride: 108 mmol/L (ref 98–111)
Creatinine, Ser: 8.62 mg/dL — ABNORMAL HIGH (ref 0.61–1.24)
GFR calc Af Amer: 7 mL/min — ABNORMAL LOW (ref >60)
GFR calc non Af Amer: 6 mL/min — ABNORMAL LOW (ref >60)
Glucose, Bld: 87 mg/dL (ref 70–99)
Phosphorus: 5.6 mg/dL — ABNORMAL HIGH (ref 2.5–4.6)
Potassium: 3.9 mmol/L (ref 3.5–5.1)
Sodium: 139 mmol/L (ref 135–145)

## 2018-06-10 MED ORDER — CHLORHEXIDINE GLUCONATE CLOTH 2 % EX PADS: 6.0000 | MEDICATED_PAD | Freq: Every day | CUTANEOUS | Status: DC

## 2018-06-10 NOTE — Progress Notes (Signed)
PROGRESS NOTE    Chad Mathis  OAC:[id number] DOB: Feb 21, 1955 DOA: 06/04/2018 PCP: Verner Chol, MD    Brief Narrative:  63 y.o.malewith medical history significant ofdiabetes, bipolar disorder.Patient reports nausea and vomiting for the last 3 days. The symptoms have worsened. He has not been able to tolerate a diet. He has not taken anything to help with the symptoms. No associated chest pain, shortness of breath, abdominal pain, diarrhea. He reports decreased urine output since Monday.  Assessment & Plan:   Principal Problem:   Acute renal failure (HCC) Active Problems:   Diabetes mellitus ([place])   SIRS (systemic inflammatory response syndrome) (HCC)  Acute on chronic renal failure: Stage III at baseline secondary to diabetes most likely -Patient currently oliguric -Received emergent hemodialysis on 06/04/2018 -No need for hemodialysis at this point; but might require it soon  -Creatinine trended up to 8.62 -continue bicarb BID -Electrolytes stable currently -Nephrology following, fluids on now hold -Continue avoiding nephrotoxic agents.  SIRS -Currently afebrile and with normal wBC's -COVID-19 -Continue to follow final culture results (no growth or microorganism isolated) -Continue monitoring off antibiotics -clinically stable at this time.  Nausea/vomiting -Secondary to uremia versus underlying viral gastroenteritis -Continue as needed antiemetics -Continue IV fluids -Cont diet as tolerated  Hyperkalemia/hypokalemia -In the setting of acute renal failure -Improved and resolved after hemodialysis -Patient also receive bicarbonate, Lokelma and calcium gluconate at time of admission. -Continue to follow electrolytes and further replete as needed.     Type II diabetes mellitus with nephropathy -Continue sliding scale insulin -Follow CBGs and adjust hypoglycemic regimen as needed -Continue holding oral hypoglycemic agents. -Glucose trends remain  stable  Bipolar disorder/schizophrenia -Continue Depakote and Geodon -No recent suicidal ideation or hallucinations noted  Elevated transaminitis -No prior history of liver disease -Continue holding statins -Follow LFTs trend -Most likely associated with shock liver and rhabdomyolysis. -CK trended up to 11,726  Essential HTN -continue holding antihypertensive agents) -Stable at this time  Urinary retention -Continue the use of Foley catheter. -Follow I's and O's  Insomnia -will continue the use of Restoril as needed.   DVT prophylaxis: Heparin subQ Code Status: Full Family Communication: Pt in room, family not at bedside Disposition Plan: Uncertain at this time  Consultants:   Nephrology  Procedures:     Antimicrobials: Anti-infectives (From admission, onward)   Start     Dose/Rate Route Frequency Ordered Stop   06/04/18 2200  piperacillin-tazobactam (ZOSYN) IVPB 2.25 g  Status:  Discontinued     2.25 g 100 mL/hr over 30 Minutes Intravenous Every 8 hours 06/04/18 1250 06/06/18 1846   06/04/18 1300  piperacillin-tazobactam (ZOSYN) IVPB 3.375 g     3.375 g 100 mL/hr over 30 Minutes Intravenous  Once 06/04/18 1250 06/04/18 1432   06/04/18 1100  cefTRIAXone (ROCEPHIN) 1 g in sodium chloride 0.9 % 100 mL IVPB     1 g 200 mL/hr over 30 Minutes Intravenous  Once 06/04/18 1054 06/04/18 1215       Subjective: Without complaints this AM   Objective: Vitals:   06/10/18 0748 06/10/18 0835 06/10/18 1203 06/10/18 1400  BP:  125/78  118/78  Pulse:  89  93  Resp:  18  20  Temp: 97.6 F (36.4 C)  97.6 F (36.4 C)   TempSrc: Axillary  Axillary   SpO2:  99%  99%  Weight:        Intake/Output Summary (Last 24 hours) at 06/10/2018 1551 Last data filed at 06/10/2018 1100 Gross per 24  hour  Intake 660 ml  Output 150 ml  Net 510 ml   Filed Weights   06/04/18 1715 06/04/18 2006 06/04/18 2056  Weight: 56.4 kg 56.7 kg 56.7 kg    Examination:  General exam:  Appears calm and comfortable  Respiratory system: Clear to auscultation. Respiratory effort normal. Cardiovascular system: S1 & S2 heard, RRR Gastrointestinal system: Abdomen is nondistended, pos BS Central nervous system: Alert and oriented. No focal neurological deficits. Extremities: Symmetric 5 x 5 power. Skin: No rashes, lesions  Psychiatry: Judgement and insight appear normal. Mood & affect appropriate.   Data Reviewed: I have personally reviewed following labs and imaging studies  CBC: Recent Labs  Lab 06/04/18 0957 06/05/18 0715 06/05/18 1207 06/06/18 0316  WBC 13.5* 7.0  --  6.7  NEUTROABS 12.0*  --   --   --   HGB 12.1* 10.0*  --  9.8*  HCT 39.4 30.1* 28.3* 28.5*  MCV 81.9 74.7*  --  75.4*  PLT 242 205  --  209   Basic Metabolic Panel: Recent Labs  Lab 06/06/18 0316 06/07/18 0349 06/08/18 0403 06/09/18 0606 06/10/18 0326  NA 139 140 136 139 139  K 2.9* 2.9* 3.5 3.9 3.9  CL 98 105 107 108 108  CO2 24 22 17* 17* 18*  GLUCOSE 105* 80 80 95 87  BUN 30* 33* 33* 38* 43*  CREATININE 5.73* 6.48* 7.15* 7.90* 8.62*  CALCIUM 8.0* 7.7* 8.0* 8.5* 8.3*  PHOS  --   --   --  5.5* 5.6*   GFR: CrCl cannot be calculated (Unknown ideal weight.). Liver Function Tests: Recent Labs  Lab 06/04/18 0957 06/05/18 0715 06/06/18 0316 06/07/18 0349 06/08/18 0403 06/09/18 0606 06/10/18 0326  AST 262* 199* 213* 215* 242*  --   --   ALT 184* 157* 162* 161* 193*  --   --   ALKPHOS 48 35* 31* 30* 32*  --   --   BILITOT 0.7 0.6 0.7 0.6 0.7  --   --   PROT 7.3 5.4* 4.9* 4.5* 4.3*  --   --   ALBUMIN 3.8 2.7* 2.5* 2.2* 2.2* 2.3* 2.1*   No results for input(s): LIPASE, AMYLASE in the last 168 hours. No results for input(s): AMMONIA in the last 168 hours. Coagulation Profile: Recent Labs  Lab 06/04/18 1044  INR 1.1   Cardiac Enzymes: Recent Labs  Lab 06/05/18 1207 06/06/18 0316 06/07/18 0349 06/08/18 0403  CKTOTAL 12,583* 6,870* 9,870* 11,726*   BNP (last 3 results)  No results for input(s): PROBNP in the last 8760 hours. HbA1C: No results for input(s): HGBA1C in the last 72 hours. CBG: Recent Labs  Lab 06/09/18 2009 06/10/18 0036 06/10/18 0411 06/10/18 0747 06/10/18 1202  GLUCAP 80 88 90 83 104*   Lipid Profile: No results for input(s): CHOL, HDL, LDLCALC, TRIG, CHOLHDL, LDLDIRECT in the last 72 hours. Thyroid Function Tests: No results for input(s): TSH, T4TOTAL, FREET4, T3FREE, THYROIDAB in the last 72 hours. Anemia Panel: No results for input(s): VITAMINB12, FOLATE, FERRITIN, TIBC, IRON, RETICCTPCT in the last 72 hours. Sepsis Labs: Recent Labs  Lab 06/04/18 1000 06/04/18 1142 06/04/18 1504 06/05/18 0715 06/06/18 0316  PROCALCITON  --   --  0.58 1.25 1.20  LATICACIDVEN 6.2* 7.5*  --  3.0*  --     Recent Results (from the past 240 hour(s))  Blood Culture (routine x 2)     Status: None   Collection Time: 06/04/18  9:57 AM  Result Value Ref  Range Status   Specimen Description   Final    BLOOD RIGHT HAND Performed at [facility] [address] [place], [place] [address]    Special Requests   Final    BOTTLES DRAWN AEROBIC ONLY Blood Culture adequate volume Performed at [facility] [address], [place], [place] [address]    Culture   Final    NO GROWTH 5 DAYS Performed at [facility], [place] [address], [place], [place] [address]    Report Status 06/09/2018 FINAL  Final  Blood Culture (routine x 2)     Status: None   Collection Time: 06/04/18 10:00 AM  Result Value Ref Range Status   Specimen Description   Final    BLOOD LEFT HAND Performed at [facility] [address] [place], [place] [address]    Special Requests   Final    BOTTLES DRAWN AEROBIC AND ANAEROBIC Blood Culture results may not be optimal due to an inadequate volume of blood received in culture bottles Performed at [facility] [address], [place], [place]  [address]    Culture   Final    NO GROWTH 5 DAYS Performed at [facility], [place] [address] [place], [place] [address]    Report Status 06/09/2018 FINAL  Final  SARS Coronavirus 2 (CEPHEID - Performed in [facility] hospital lab), Hosp Order     Status: None   Collection Time: 06/04/18 10:00 AM  Result Value Ref Range Status   SARS Coronavirus 2 NEGATIVE NEGATIVE Final    Comment: (NOTE) If result is NEGATIVE SARS-CoV-2 target nucleic acids are NOT DETECTED. The SARS-CoV-2 RNA is generally detectable in upper and lower  respiratory specimens during the acute phase of infection. The lowest  concentration of SARS-CoV-2 viral copies this assay can detect is 250  copies / mL. A negative result does not preclude SARS-CoV-2 infection  and should not be used as the sole basis for treatment or other  patient management decisions.  A negative result may occur with  improper specimen collection / handling, submission of specimen other  than nasopharyngeal swab, presence of viral mutation(s) within the  areas targeted by this assay, and inadequate number of viral copies  (<250 copies / mL). A negative result must be combined with clinical  observations, patient history, and epidemiological information. If result is POSITIVE SARS-CoV-2 target nucleic acids are DETECTED. The SARS-CoV-2 RNA is generally detectable in upper and lower  respiratory specimens dur ing the acute phase of infection.  Positive  results are indicative of active infection with SARS-CoV-2.  Clinical  correlation with patient history and other diagnostic information is  necessary to determine patient infection status.  Positive results do  not rule out bacterial infection or co-infection with other viruses. If result is PRESUMPTIVE POSTIVE SARS-CoV-2 nucleic acids MAY BE PRESENT.   A presumptive positive result was obtained on the submitted specimen  and confirmed on repeat testing.  While 2019 novel coronavirus   (SARS-CoV-2) nucleic acids may be present in the submitted sample  additional confirmatory testing may be necessary for epidemiological  and / or clinical management purposes  to differentiate between  SARS-CoV-2 and other Sarbecovirus currently known to infect humans.  If clinically indicated additional testing with an alternate test  methodology [phone]) is advised. The SARS-CoV-2 RNA is generally  detectable in upper and lower respiratory sp ecimens during the acute  phase of infection. The expected result  is Negative. Fact Sheet for Patients:  StrictlyIdeas.no Fact Sheet for Healthcare Providers: BankingDealers.co.za This test is not yet approved or cleared by the [place] FDA and has been authorized for detection and/or diagnosis of SARS-CoV-2 by FDA under an Emergency Use Authorization (EUA).  This EUA will remain in effect (meaning this test can be used) for the duration of the COVID-19 declaration under Section 564(b)(1) of the Act, 21 U.S.C. section 360bbb-3(b)(1), unless the authorization is terminated or revoked sooner. Performed at [facility], [place] [address], [place], [place] [address]   Urine Culture     Status: None   Collection Time: 06/06/18 10:42 AM  Result Value Ref Range Status   Specimen Description URINE, CATHETERIZED  Final   Special Requests NONE  Final   Culture   Final    NO GROWTH Performed at [facility], [address], [place], [place] [address]    Report Status 06/07/2018 FINAL  Final  MRSA PCR Screening     Status: None   Collection Time: 06/06/18  1:50 PM  Result Value Ref Range Status   MRSA by PCR NEGATIVE NEGATIVE Final    Comment:        The GeneXpert MRSA Assay (FDA approved for NASAL specimens only), is one component of a comprehensive MRSA colonization surveillance program. It is not intended to diagnose MRSA infection nor to guide or monitor treatment  for MRSA infections. Performed at [facility], [place] [address], [place], [place] [address]      Radiology Studies: No results found.  Scheduled Meds: . aspirin  81 mg Oral Daily  . Chlorhexidine Gluconate Cloth  6 each Topical Q0600  . divalproex  125 mg Oral QHS  . ferrous sulfate  325 mg Oral Q breakfast  . fluticasone  1 spray Each Nare Daily  . furosemide  80 mg Intravenous Q12H  . heparin  5,000 Units Subcutaneous Q8H  . insulin aspart  0-9 Units Subcutaneous Q4H  . sodium bicarbonate  650 mg Oral BID  . traZODone  150 mg Oral QHS  . ziprasidone  60 mg Oral Daily   Continuous Infusions: . sodium chloride       LOS: 6 days   Marylu Lund, MD [facility] Pager On Amion  If 7PM-7AM, please contact night-coverage 06/10/2018, 3:51 PM

## 2018-06-10 NOTE — Progress Notes (Signed)
Physical Therapy Treatment Patient Details Name: Chad Mathis MRN: [id number] DOB: 1955-12-08 Today's Date: 06/10/2018    History of Present Illness Pt is a 63 y.o. male admitted from ALF on 06/04/18 with nausea, vomiting and progressive weakness. Worked up for AKI and SIRS. PMH includes legally blind, bipolar disorder, DM2, asthma.    PT Comments    Patient needed increased assist today. He reported more fatigue prior to treatment. He required mod a to stand and mod a for stability and balance. He was fatigued with a simple transfer today. Acute therapy will continue to follow.    Follow Up Recommendations  SNF     Equipment Recommendations  Rolling walker with 5" wheels    Recommendations for Other Services Rehab consult     Precautions / Restrictions Precautions Precautions: Fall Restrictions Weight Bearing Restrictions: No    Mobility  Bed Mobility Overal bed mobility: Needs Assistance Bed Mobility: Sit to Supine       Sit to supine: Mod assist   General bed mobility comments: mod a to scoot to the edge of the bed   Transfers Overall transfer level: Needs assistance Equipment used: Rolling walker (2 wheeled) Transfers: Sit to/from Stand Sit to Stand: Mod assist         General transfer comment: Mod a to stnad for balance   Ambulation/Gait Ambulation/Gait assistance: Mod assist Gait Distance (Feet): 3 Feet Assistive device: Rolling walker (2 wheeled) Gait Pattern/deviations: WFL(Within Functional Limits);Decreased step length - right;Decreased step length - left Gait velocity: decreased   General Gait Details: Mod cuing to take steps to the chair. Max cuing to keep turning his bottom    Marine scientist [place] (Stroke Patients Only)       Balance Overall balance assessment: Needs assistance Sitting-balance support: Bilateral upper extremity supported Sitting balance-Leahy Scale: Poor Sitting balance  - Comments: still required close guarding    Standing balance support: Bilateral upper extremity supported Standing balance-Leahy Scale: Poor                              Cognition Arousal/Alertness: Awake/alert Behavior During Therapy: WFL for tasks assessed/performed Overall Cognitive Status: Within Functional Limits for tasks assessed                                 General Comments: flat affect       Exercises      General Comments General comments (skin integrity, edema, etc.): sat in chair. Requested pillow behind his head despite making his neck flexed. Patient insted it was comfortable      Pertinent Vitals/Pain Pain Assessment: No/denies pain Faces Pain Scale: No hurt    Home Living                      Prior Function            PT Goals (current goals can now be found in the care plan section) Acute Rehab PT Goals Patient Stated Goal: to get out of bed   PT Goal Formulation: With patient Time For Goal Achievement: 06/12/18 Potential to Achieve Goals: Good Progress towards PT goals: Progressing toward goals    Frequency    Min 2X/week      PT Plan Current plan remains appropriate  Co-evaluation              AM-PAC PT "6 Clicks" Mobility   Outcome Measure  Help needed turning from your back to your side while in a flat bed without using bedrails?: A Lot Help needed moving from lying on your back to sitting on the side of a flat bed without using bedrails?: A Lot Help needed moving to and from a bed to a chair (including a wheelchair)?: A Little Help needed standing up from a chair using your arms (e.g., wheelchair or bedside chair)?: A Little Help needed to walk in hospital room?: A Lot Help needed climbing 3-5 steps with a railing? : Total 6 Click Score: 13    End of Session Equipment Utilized During Treatment: Gait belt Activity Tolerance: Patient tolerated treatment well Patient left: in chair;with  call bell/phone within reach;with chair alarm set Nurse Communication: Mobility status PT Visit Diagnosis: Unsteadiness on feet (R26.81);Muscle weakness (generalized) (M62.81)     Time: 1520-1540 PT Time Calculation (min) (ACUTE ONLY): 20 min  Charges:  $Therapeutic Activity: 8-22 mins                        Carney Living PT DPT  06/10/2018, 4:34 PM

## 2018-06-10 NOTE — Progress Notes (Signed)
Patient ID: Chad Mathis, male   DOB: 09-11-1955, 63 y.o.   MRN: [id number] S:  100 UOP recorded -still has foley-- seems alert but says feels weak today   O:BP 124/76   Pulse 88   Temp 97.6 F (36.4 C) (Axillary)   Resp 20   Wt 56.7 kg   SpO2 97%   BMI 22.14 kg/m   Intake/Output Summary (Last 24 hours) at 06/10/2018 1144 Last data filed at 06/10/2018 1100 Gross per 24 hour  Intake 660 ml  Output 150 ml  Net 510 ml   Intake/Output: I/O last 3 completed shifts: In: 610 [P.O.:610] Out: 100 [Urine:100]  Intake/Output this shift:  Total I/O In: 240 [P.O.:240] Out: 50 [Urine:50] Weight change:  Gen:NAD CVS: no rub  Resp: cta Abd: benign Ext: dep pitting edema  Recent Labs  Lab 06/04/18 0957 06/04/18 1504 06/05/18 0715 06/06/18 0316 06/07/18 0349 06/08/18 0403 06/09/18 0606 06/10/18 0326  NA 133* 136 139 139 140 136 139 139  K 7.0* 6.3* 3.2* 2.9* 2.9* 3.5 3.9 3.9  CL 97* 99 92* 98 105 107 108 108  CO2 <7* 9* 26 24 22  17* 17* 18*  GLUCOSE 66* 206* 106* 105* 80 80 95 87  BUN 88* 84* 26* 30* 33* 33* 38* 43*  CREATININE 8.86* 8.35* 4.87* 5.73* 6.48* 7.15* 7.90* 8.62*  ALBUMIN 3.8  --  2.7* 2.5* 2.2* 2.2* 2.3* 2.1*  CALCIUM 8.1* 7.4* 8.7* 8.0* 7.7* 8.0* 8.5* 8.3*  PHOS  --   --   --   --   --   --  5.5* 5.6*  AST 262*  --  199* 213* 215* 242*  --   --   ALT 184*  --  157* 162* 161* 193*  --   --    Liver Function Tests: Recent Labs  Lab 06/06/18 0316 06/07/18 0349 06/08/18 0403 06/09/18 0606 06/10/18 0326  AST 213* 215* 242*  --   --   ALT 162* 161* 193*  --   --   ALKPHOS 31* 30* 32*  --   --   BILITOT 0.7 0.6 0.7  --   --   PROT 4.9* 4.5* 4.3*  --   --   ALBUMIN 2.5* 2.2* 2.2* 2.3* 2.1*   No results for input(s): LIPASE, AMYLASE in the last 168 hours. No results for input(s): AMMONIA in the last 168 hours. CBC: Recent Labs  Lab 06/04/18 0957 06/05/18 0715 06/05/18 1207 06/06/18 0316  WBC 13.5* 7.0  --  6.7  NEUTROABS 12.0*  --   --   --   HGB  12.1* 10.0*  --  9.8*  HCT 39.4 30.1* 28.3* 28.5*  MCV 81.9 74.7*  --  75.4*  PLT 242 205  --  160   Cardiac Enzymes: Recent Labs  Lab 06/05/18 1207 06/06/18 0316 06/07/18 0349 06/08/18 0403  CKTOTAL 12,583* 6,870* 9,870* 11,726*   CBG: Recent Labs  Lab 06/09/18 1614 06/09/18 2009 06/10/18 0036 06/10/18 0411 06/10/18 0747  GLUCAP 96 80 88 90 83    Iron Studies:  No results for input(s): IRON, TIBC, TRANSFERRIN, FERRITIN in the last 72 hours. Studies/Results: No results found. Mar[place] Kitchen aspirin  81 mg Oral Daily  . Chlorhexidine Gluconate Cloth  6 each Topical Q0600  . divalproex  125 mg Oral QHS  . ferrous sulfate  325 mg Oral Q breakfast  . fluticasone  1 spray Each Nare Daily  . furosemide  80 mg Intravenous Q12H  . heparin  5,000 Units Subcutaneous Q8H  . insulin aspart  0-9 Units Subcutaneous Q4H  . sodium bicarbonate  650 mg Oral BID  . traZODone  150 mg Oral QHS  . ziprasidone  60 mg Oral Daily    BMET    Component Value Date/Time   NA 139 06/10/2018 0326   K 3.9 06/10/2018 0326   CL 108 06/10/2018 0326   CO2 18 (L) 06/10/2018 0326   GLUCOSE 87 06/10/2018 0326   BUN 43 (H) 06/10/2018 0326   CREATININE 8.62 (H) 06/10/2018 0326   CALCIUM 8.3 (L) 06/10/2018 0326   GFRNONAA 6 (L) 06/10/2018 0326   GFRAA 7 (L) 06/10/2018 0326   CBC    Component Value Date/Time   WBC 6.7 06/06/2018 0316   RBC 3.78 (L) 06/06/2018 0316   HGB 9.8 (L) 06/06/2018 0316   HCT 28.5 (L) 06/06/2018 0316   HCT 28.3 (L) 06/05/2018 1207   PLT 160 06/06/2018 0316   MCV 75.4 (L) 06/06/2018 0316   MCH 25.9 (L) 06/06/2018 0316   MCHC 34.4 06/06/2018 0316   RDW 13.2 06/06/2018 0316   LYMPHSABS 0.6 (L) 06/04/2018 0957   MONOABS 0.8 06/04/2018 0957   EOSABS 0.1 06/04/2018 0957   BASOSABS 0.0 06/04/2018 0957    Assessment/Plan:  1. AKI/CKD stage 3 (last known Cr was 1.42 in Dec 2017). Oliguric presumably multifactorial; volume depletion due to N/V in setting of concomitant ACE  inhibition and diuretics with superimposed rhabdomyolysis. s/p urgent HD on 06/04/18 due to hyperkalemia.  remains oliguric. [place] without hydro but did show increased echogenicity consistent with chronic medical renal disease. 1. Now with foley cath in place- minimal UOP 2. Continue to hold benazepril, hctz, and glucophage 3. Avoid nephrotoxic agents 4.  stopping IVF now as I feel tank is full even though CK is still high - give lasix with marginal response 5. UOP improving s/p foley but remains oliguric  6. No absolute indication for dialysis at this time but he feels badly and numbers do continue to worsen so will do HD non emergent today  vascath placed 5/28.  Continue to follow closely 2. Metabolic acidosis- due to #1 and concomitant metformin. Improved with isotonic bicarb- drifting down again , now oral bicarb 3. Rhabdomyolysis- likely contributing to AKI but unclear etiology other than statin therapy. No history of falls or being found down.  follow levels, check ck tomorrow  4. Hyperkalemia-Now hypokalemic.  1. Replete and follow 5. Anemia- presumably due to CKD, however unknown baseline Cr. Will check SPEP/UPEP, iron stores OK and follow.  6. SIRS- on zosyn and cultures pending. covid-19 negative  7. DM- per primary svc 8. HTN- low BP's on arrival, meds on hold 9. Elevated AST/ALT- likely due to shock liver but r/o infectious and agree with holding statin   Louis Meckel

## 2018-06-10 NOTE — Progress Notes (Signed)
Pt up in chair. I checked with dialysis - plan is for dialysis on next shift, after seven. I updated patient. No needs voiced.

## 2018-06-11 LAB — RENAL FUNCTION PANEL
Albumin: 2.3 g/dL — ABNORMAL LOW (ref 3.5–5.0)
Anion gap: 13 (ref 5–15)
BUN: 50 mg/dL — ABNORMAL HIGH (ref 8–23)
CO2: 17 mmol/L — ABNORMAL LOW (ref 22–32)
Calcium: 8.6 mg/dL — ABNORMAL LOW (ref 8.9–10.3)
Chloride: 108 mmol/L (ref 98–111)
Creatinine, Ser: 9.75 mg/dL — ABNORMAL HIGH (ref 0.61–1.24)
GFR calc Af Amer: 6 mL/min — ABNORMAL LOW (ref >60)
GFR calc non Af Amer: 5 mL/min — ABNORMAL LOW (ref >60)
Glucose, Bld: 90 mg/dL (ref 70–99)
Phosphorus: 5.8 mg/dL — ABNORMAL HIGH (ref 2.5–4.6)
Potassium: 4.1 mmol/L (ref 3.5–5.1)
Sodium: 138 mmol/L (ref 135–145)

## 2018-06-11 LAB — GLUCOSE, CAPILLARY
Glucose-Capillary: 69 mg/dL — ABNORMAL LOW (ref 70–99)
Glucose-Capillary: 113 mg/dL — ABNORMAL HIGH (ref 70–99)
Glucose-Capillary: 83 mg/dL (ref 70–99)
Glucose-Capillary: 97 mg/dL (ref 70–99)
Glucose-Capillary: 85 mg/dL (ref 70–99)

## 2018-06-11 LAB — CK: Total CK: 5411 U/L — ABNORMAL HIGH (ref 49–397)

## 2018-06-11 LAB — IMMUNOFIXATION, URINE

## 2018-06-11 MED ORDER — PENTAFLUOROPROP-TETRAFLUOROETH EX AERO: 1.0000 "application " | INHALATION_SPRAY | CUTANEOUS | Status: DC | PRN

## 2018-06-11 MED ORDER — SODIUM CHLORIDE 0.9 % IV SOLN: 100.0000 mL | INTRAVENOUS | Status: DC | PRN

## 2018-06-11 MED ORDER — LIDOCAINE-PRILOCAINE 2.5-2.5 % EX CREA: 1.0000 "application " | TOPICAL_CREAM | CUTANEOUS | Status: DC | PRN

## 2018-06-11 MED ORDER — ALTEPLASE 2 MG IJ SOLR: 2.0000 mg | Freq: Once | INTRAMUSCULAR | Status: DC | PRN

## 2018-06-11 MED ORDER — HEPARIN SODIUM (PORCINE) 1000 UNIT/ML IJ SOLN
INTRAMUSCULAR | Status: AC
  Filled 2018-06-11: qty 4

## 2018-06-11 MED ORDER — HEPARIN SODIUM (PORCINE) 1000 UNIT/ML DIALYSIS: 1000.0000 [IU] | INTRAMUSCULAR | Status: DC | PRN

## 2018-06-11 MED ORDER — LIDOCAINE HCL (PF) 1 % IJ SOLN: 5.0000 mL | INTRAMUSCULAR | Status: DC | PRN

## 2018-06-11 MED ORDER — HEPARIN SODIUM (PORCINE) 1000 UNIT/ML DIALYSIS: 20.0000 [IU]/kg | INTRAMUSCULAR | Status: DC | PRN

## 2018-06-11 NOTE — Procedures (Signed)
Patient was seen on dialysis and the procedure was supervised.  BFR 300  Via vascath  BP is  110/70.   Patient appears to be tolerating treatment well  Louis Meckel 06/11/2018

## 2018-06-11 NOTE — Progress Notes (Addendum)
Foley discontinued per order. Pt educated on procedure, tolerated well. Discussed use of urinal and need to void within appropriate time frame. Pt ate some of lunch, reports not hungry but did seem more alert and cheerful after lunch.  1815 - No void. Pt reports no urge to void. Bladder scanned for 37mL. Urinal at side of bed within reach. Will continue to monitor.

## 2018-06-11 NOTE — Plan of Care (Signed)
  Problem: Health Behavior/Discharge Planning: Goal: Ability to manage health-related needs will improve Outcome: Progressing   Problem: Clinical Measurements: Goal: Ability to maintain clinical measurements within normal limits will improve Outcome: Progressing Goal: Will remain free from infection Outcome: Progressing   Problem: Activity: Goal: Risk for activity intolerance will decrease Outcome: Progressing   

## 2018-06-11 NOTE — Progress Notes (Addendum)
PROGRESS NOTE    DAVEYON KITCHINGS  TFT:[id number] DOB: [birth year] DOA: 06/04/2018 PCP: Verner Chol, MD    Brief Narrative:  63 y.o.malewith medical history significant ofdiabetes, bipolar disorder.Patient reports nausea and vomiting for the last 3 days. The symptoms have worsened. He has not been able to tolerate a diet. He has not taken anything to help with the symptoms. No associated chest pain, shortness of breath, abdominal pain, diarrhea. He reports decreased urine output since Monday.  Assessment & Plan:   Principal Problem:   Acute renal failure (HCC) Active Problems:   Diabetes mellitus ([place])   SIRS (systemic inflammatory response syndrome) (HCC)  Acute on chronic renal failure: Stage III at baseline secondary to diabetes most likely -Patient currently oliguric -Received emergent hemodialysis on 06/04/2018 -No need for hemodialysis at this point; but might require it soon  -Creatinine trended up to 8.62 -continue bicarb BID -Electrolytes stable currently -Nephrology following, fluids are on hold. Pt underwent HD 6/4  SIRS -Currently afebrile and with normal wBC's -COVID-19 -Continue to follow final culture results (no growth or microorganism isolated) -Continue monitoring off antibiotics -remains clinically stable at this time.  Nausea/vomiting -Secondary to uremia versus underlying viral gastroenteritis -Continue as needed antiemetics -Continue IV fluids -Cont diet as tolerated  Hyperkalemia/hypokalemia -In the setting of acute renal failure -Improved and resolved after hemodialysis -Patient also receive bicarbonate, Lokelma and calcium gluconate at time of admission. -Continue to follow electrolytes and further replete as needed.     Type II diabetes mellitus with nephropathy -Continue sliding scale insulin -Follow CBGs and adjust hypoglycemic regimen as needed -Continue holding oral hypoglycemic agents. -Glucose trends remain stable   Bipolar disorder/schizophrenia -Continue Depakote and Geodon -No recent suicidal ideation or hallucinations noted  Elevated transaminitis -No prior history of liver disease -Continue holding statins -Follow LFTs trend -Most likely associated with shock liver and rhabdomyolysis. -CK trended up to 11,726, down to 5,411  Essential HTN -continue holding antihypertensive agents) -Stable at this time  Urinary retention -Continue the use of Foley catheter. -Follow I's and O's  Insomnia -will continue the use of Restoril as needed.   DVT prophylaxis: Heparin subQ Code Status: Full Family Communication: Pt in room, family not at bedside Disposition Plan: Uncertain at this time  Consultants:   Nephrology  Procedures:     Antimicrobials: Anti-infectives (From admission, onward)   Start     Dose/Rate Route Frequency Ordered Stop   06/04/18 2200  piperacillin-tazobactam (ZOSYN) IVPB 2.25 g  Status:  Discontinued     2.25 g 100 mL/hr over 30 Minutes Intravenous Every 8 hours 06/04/18 1250 06/06/18 1846   06/04/18 1300  piperacillin-tazobactam (ZOSYN) IVPB 3.375 g     3.375 g 100 mL/hr over 30 Minutes Intravenous  Once 06/04/18 1250 06/04/18 1432   06/04/18 1100  cefTRIAXone (ROCEPHIN) 1 g in sodium chloride 0.9 % 100 mL IVPB     1 g 200 mL/hr over 30 Minutes Intravenous  Once 06/04/18 1054 06/04/18 1215      Subjective: No complaints this AM when seen on HD  Objective: Vitals:   06/11/18 0930 06/11/18 0949 06/11/18 1202 06/11/18 1621  BP: (!) 108/37 125/84    Pulse: 78 86    Resp: 16 16    Temp:  97.6 F (36.4 C) 98.1 F (36.7 C) 97.9 F (36.6 C)  TempSrc:  Oral Oral Oral  SpO2:  99%    Weight:  66.5 kg      Intake/Output Summary (Last 24 hours) at  06/11/2018 1719 Last data filed at 06/11/2018 0949 Gross per 24 hour  Intake -  Output 1050 ml  Net -1050 ml   Filed Weights   06/04/18 2056 06/11/18 0642 06/11/18 0949  Weight: 56.7 kg 68.5 kg 66.5 kg     Examination: General exam: Asleep, arousable, laying in bed, in nad Respiratory system: Normal respiratory effort, no wheezing  Data Reviewed: I have personally reviewed following labs and imaging studies  CBC: Recent Labs  Lab 06/05/18 0715 06/05/18 1207 06/06/18 0316  WBC 7.0  --  6.7  HGB 10.0*  --  9.8*  HCT 30.1* 28.3* 28.5*  MCV 74.7*  --  75.4*  PLT 205  --  161   Basic Metabolic Panel: Recent Labs  Lab 06/07/18 0349 06/08/18 0403 06/09/18 0606 06/10/18 0326 06/11/18 0453  NA 140 136 139 139 138  K 2.9* 3.5 3.9 3.9 4.1  CL 105 107 108 108 108  CO2 22 17* 17* 18* 17*  GLUCOSE 80 80 95 87 90  BUN 33* 33* 38* 43* 50*  CREATININE 6.48* 7.15* 7.90* 8.62* 9.75*  CALCIUM 7.7* 8.0* 8.5* 8.3* 8.6*  PHOS  --   --  5.5* 5.6* 5.8*   GFR: CrCl cannot be calculated (Unknown ideal weight.). Liver Function Tests: Recent Labs  Lab 06/05/18 0715 06/06/18 0316 06/07/18 0349 06/08/18 0403 06/09/18 0606 06/10/18 0326 06/11/18 0453  AST 199* 213* 215* 242*  --   --   --   ALT 157* 162* 161* 193*  --   --   --   ALKPHOS 35* 31* 30* 32*  --   --   --   BILITOT 0.6 0.7 0.6 0.7  --   --   --   PROT 5.4* 4.9* 4.5* 4.3*  --   --   --   ALBUMIN 2.7* 2.5* 2.2* 2.2* 2.3* 2.1* 2.3*   No results for input(s): LIPASE, AMYLASE in the last 168 hours. No results for input(s): AMMONIA in the last 168 hours. Coagulation Profile: No results for input(s): INR, PROTIME in the last 168 hours. Cardiac Enzymes: Recent Labs  Lab 06/05/18 1207 06/06/18 0316 06/07/18 0349 06/08/18 0403 06/11/18 0453  CKTOTAL 12,583* 6,870* 9,870* 11,726* 5,411*   BNP (last 3 results) No results for input(s): PROBNP in the last 8760 hours. HbA1C: No results for input(s): HGBA1C in the last 72 hours. CBG: Recent Labs  Lab 06/10/18 2029 06/11/18 0414 06/11/18 0600 06/11/18 1203 06/11/18 1620  GLUCAP 90 69* 113* 83 97   Lipid Profile: No results for input(s): CHOL, HDL, LDLCALC, TRIG, CHOLHDL,  LDLDIRECT in the last 72 hours. Thyroid Function Tests: No results for input(s): TSH, T4TOTAL, FREET4, T3FREE, THYROIDAB in the last 72 hours. Anemia Panel: No results for input(s): VITAMINB12, FOLATE, FERRITIN, TIBC, IRON, RETICCTPCT in the last 72 hours. Sepsis Labs: Recent Labs  Lab 06/05/18 0715 06/06/18 0316  PROCALCITON 1.25 1.20  LATICACIDVEN 3.0*  --     Recent Results (from the past 240 hour(s))  Blood Culture (routine x 2)     Status: None   Collection Time: 06/04/18  9:57 AM  Result Value Ref Range Status   Specimen Description   Final    BLOOD RIGHT HAND Performed at [facility] [address], [place], [place] [address]    Special Requests   Final    BOTTLES DRAWN AEROBIC ONLY Blood Culture adequate volume Performed at [facility] [address] [place], [place] [address]  Culture   Final    NO GROWTH 5 DAYS Performed at [facility], [place] [address] [place], [place] [address]    Report Status 06/09/2018 FINAL  Final  Blood Culture (routine x 2)     Status: None   Collection Time: 06/04/18 10:00 AM  Result Value Ref Range Status   Specimen Description   Final    BLOOD LEFT HAND Performed at [facility] [address], [place], [place] [address]    Special Requests   Final    BOTTLES DRAWN AEROBIC AND ANAEROBIC Blood Culture results may not be optimal due to an inadequate volume of blood received in culture bottles Performed at [facility] [address], [place],  [address]    Culture   Final    NO GROWTH 5 DAYS Performed at [facility], [place] [address], [place],  [address]    Report Status 06/09/2018 FINAL  Final  SARS Coronavirus 2 (CEPHEID - Performed in [facility] lab), Hosp Order     Status: None   Collection Time: 06/04/18 10:00 AM  Result Value Ref Range Status   SARS Coronavirus 2 NEGATIVE NEGATIVE Final    Comment: (NOTE)  If result is NEGATIVE SARS-CoV-2 target nucleic acids are NOT DETECTED. The SARS-CoV-2 RNA is generally detectable in upper and lower  respiratory specimens during the acute phase of infection. The lowest  concentration of SARS-CoV-2 viral copies this assay can detect is 250  copies / mL. A negative result does not preclude SARS-CoV-2 infection  and should not be used as the sole basis for treatment or other  patient management decisions.  A negative result may occur with  improper specimen collection / handling, submission of specimen other  than nasopharyngeal swab, presence of viral mutation(s) within the  areas targeted by this assay, and inadequate number of viral copies  (<250 copies / mL). A negative result must be combined with clinical  observations, patient history, and epidemiological information. If result is POSITIVE SARS-CoV-2 target nucleic acids are DETECTED. The SARS-CoV-2 RNA is generally detectable in upper and lower  respiratory specimens dur ing the acute phase of infection.  Positive  results are indicative of active infection with SARS-CoV-2.  Clinical  correlation with patient history and other diagnostic information is  necessary to determine patient infection status.  Positive results do  not rule out bacterial infection or co-infection with other viruses. If result is PRESUMPTIVE POSTIVE SARS-CoV-2 nucleic acids MAY BE PRESENT.   A presumptive positive result was obtained on the submitted specimen  and confirmed on repeat testing.  While 2019 novel coronavirus  (SARS-CoV-2) nucleic acids may be present in the submitted sample  additional confirmatory testing may be necessary for epidemiological  and / or clinical management purposes  to differentiate between  SARS-CoV-2 and other Sarbecovirus currently known to infect humans.  If clinically indicated additional testing with an alternate test  methodology [phone]) is advised. The SARS-CoV-2 RNA is generally   detectable in upper and lower respiratory sp ecimens during the acute  phase of infection. The expected result is Negative. Fact Sheet for Patients:  StrictlyIdeas.no Fact Sheet for Healthcare Providers: BankingDealers.co.za This test is not yet approved or cleared by the [place] FDA and has been authorized for detection and/or diagnosis of SARS-CoV-2 by FDA under an Emergency Use Authorization (EUA).  This EUA will remain in effect (meaning this test can be used) for the duration of the  COVID-19 declaration under Section 564(b)(1) of the Act, 21 U.S.C. section 360bbb-3(b)(1), unless the authorization is terminated or revoked sooner. Performed at [facility], [place] [address] [place], [place] [address]   Urine Culture     Status: None   Collection Time: 06/06/18 10:42 AM  Result Value Ref Range Status   Specimen Description URINE, CATHETERIZED  Final   Special Requests NONE  Final   Culture   Final    NO GROWTH Performed at [facility], [address], [place], [place] [address]    Report Status 06/07/2018 FINAL  Final  MRSA PCR Screening     Status: None   Collection Time: 06/06/18  1:50 PM  Result Value Ref Range Status   MRSA by PCR NEGATIVE NEGATIVE Final    Comment:        The GeneXpert MRSA Assay (FDA approved for NASAL specimens only), is one component of a comprehensive MRSA colonization surveillance program. It is not intended to diagnose MRSA infection nor to guide or monitor treatment for MRSA infections. Performed at [facility], [place] [address] [place], [place] [address]      Radiology Studies: No results found.  Scheduled Meds: . heparin      . aspirin  81 mg Oral Daily  . Chlorhexidine Gluconate Cloth  6 each Topical Q0600  . divalproex  125 mg Oral QHS  . ferrous sulfate  325 mg Oral Q breakfast  . fluticasone  1 spray Each Nare Daily  . furosemide  80 mg  Intravenous Q12H  . heparin  5,000 Units Subcutaneous Q8H  . insulin aspart  0-9 Units Subcutaneous Q4H  . sodium bicarbonate  650 mg Oral BID  . traZODone  150 mg Oral QHS  . ziprasidone  60 mg Oral Daily   Continuous Infusions: . sodium chloride       LOS: 7 days   Marylu Lund, MD [facility] Pager On Amion  If 7PM-7AM, please contact night-coverage 06/11/2018, 5:19 PM

## 2018-06-11 NOTE — TOC Progression Note (Signed)
Transition of Care [facility]) - Progression Note    Patient Details  Name: Chad Mathis MRN: [id number] Date of Birth: 1955/11/24  Transition of Care [facility]) CM/SW [place], LCSW Phone Number: 06/11/2018, 11:48 AM  Clinical Narrative:    [facility] [place] has received insurance approval but it will only be good until tomorrow. CSW will follow for medical readiness and HD needs.    Expected Discharge Plan: [place] Barriers to Discharge: No Barriers Identified  Expected Discharge Plan and Services Expected Discharge Plan: [place] In-house Referral: Clinical Social Work Discharge Planning Services: NA Post Acute Care Choice: [place] Living arrangements for the past 2 months: [place] Expected Discharge Date: (unknown)               DME Arranged: N/A DME Agency: NA       HH Arranged: NA HH Agency: NA         Social Determinants of Health (SDOH) Interventions    Readmission Risk Interventions No flowsheet data found.

## 2018-06-11 NOTE — Progress Notes (Signed)
Patient ID: Chad Mathis, male   DOB: [birth year], 63 y.o.   MRN: [id number] S:  100 UOP recorded -still has foley-- had ordered HD for yesterday- not done til early this MA- almost finished   O:BP (!) 108/37   Pulse 78   Temp 98.5 F (36.9 C) (Oral)   Resp 16   Wt 68.5 kg   SpO2 99%   BMI 26.75 kg/m   Intake/Output Summary (Last 24 hours) at 06/11/2018 0941 Last data filed at 06/11/2018 3354 Gross per 24 hour  Intake -  Output 100 ml  Net -100 ml   Intake/Output: I/O last 3 completed shifts: In: 600 [P.O.:600] Out: 100 [Urine:100]  Intake/Output this shift:  No intake/output data recorded. Weight change:  Gen:NAD CVS: no rub  Resp: cta Abd: benign Ext: dep pitting edema  Recent Labs  Lab 06/04/18 0957  06/05/18 0715 06/06/18 0316 06/07/18 0349 06/08/18 0403 06/09/18 0606 06/10/18 0326 06/11/18 0453  NA 133*   < > 139 139 140 136 139 139 138  K 7.0*   < > 3.2* 2.9* 2.9* 3.5 3.9 3.9 4.1  CL 97*   < > 92* 98 105 107 108 108 108  CO2 <7*   < > 26 24 22  17* 17* 18* 17*  GLUCOSE 66*   < > 106* 105* 80 80 95 87 90  BUN 88*   < > 26* 30* 33* 33* 38* 43* 50*  CREATININE 8.86*   < > 4.87* 5.73* 6.48* 7.15* 7.90* 8.62* 9.75*  ALBUMIN 3.8  --  2.7* 2.5* 2.2* 2.2* 2.3* 2.1* 2.3*  CALCIUM 8.1*   < > 8.7* 8.0* 7.7* 8.0* 8.5* 8.3* 8.6*  PHOS  --   --   --   --   --   --  5.5* 5.6* 5.8*  AST 262*  --  199* 213* 215* 242*  --   --   --   ALT 184*  --  157* 162* 161* 193*  --   --   --    < > = values in this interval not displayed.   Liver Function Tests: Recent Labs  Lab 06/06/18 0316 06/07/18 0349 06/08/18 0403 06/09/18 0606 06/10/18 0326 06/11/18 0453  AST 213* 215* 242*  --   --   --   ALT 162* 161* 193*  --   --   --   ALKPHOS 31* 30* 32*  --   --   --   BILITOT 0.7 0.6 0.7  --   --   --   PROT 4.9* 4.5* 4.3*  --   --   --   ALBUMIN 2.5* 2.2* 2.2* 2.3* 2.1* 2.3*   No results for input(s): LIPASE, AMYLASE in the last 168 hours. No results for input(s):  AMMONIA in the last 168 hours. CBC: Recent Labs  Lab 06/04/18 0957 06/05/18 0715 06/05/18 1207 06/06/18 0316  WBC 13.5* 7.0  --  6.7  NEUTROABS 12.0*  --   --   --   HGB 12.1* 10.0*  --  9.8*  HCT 39.4 30.1* 28.3* 28.5*  MCV 81.9 74.7*  --  75.4*  PLT 242 205  --  160   Cardiac Enzymes: Recent Labs  Lab 06/05/18 1207 06/06/18 0316 06/07/18 0349 06/08/18 0403 06/11/18 0453  CKTOTAL 12,583* 6,870* 9,870* 11,726* 5,411*   CBG: Recent Labs  Lab 06/10/18 1202 06/10/18 1608 06/10/18 2029 06/11/18 0414 06/11/18 0600  GLUCAP 104* 99 90 69* 113*    Iron  Studies:  No results for input(s): IRON, TIBC, TRANSFERRIN, FERRITIN in the last 72 hours. Studies/Results: No results found. Marland Kitchen aspirin  81 mg Oral Daily  . Chlorhexidine Gluconate Cloth  6 each Topical Q0600  . divalproex  125 mg Oral QHS  . ferrous sulfate  325 mg Oral Q breakfast  . fluticasone  1 spray Each Nare Daily  . furosemide  80 mg Intravenous Q12H  . heparin  5,000 Units Subcutaneous Q8H  . insulin aspart  0-9 Units Subcutaneous Q4H  . sodium bicarbonate  650 mg Oral BID  . traZODone  150 mg Oral QHS  . ziprasidone  60 mg Oral Daily    BMET    Component Value Date/Time   NA 138 06/11/2018 0453   K 4.1 06/11/2018 0453   CL 108 06/11/2018 0453   CO2 17 (L) 06/11/2018 0453   GLUCOSE 90 06/11/2018 0453   BUN 50 (H) 06/11/2018 0453   CREATININE 9.75 (H) 06/11/2018 0453   CALCIUM 8.6 (L) 06/11/2018 0453   GFRNONAA 5 (L) 06/11/2018 0453   GFRAA 6 (L) 06/11/2018 0453   CBC    Component Value Date/Time   WBC 6.7 06/06/2018 0316   RBC 3.78 (L) 06/06/2018 0316   HGB 9.8 (L) 06/06/2018 0316   HCT 28.5 (L) 06/06/2018 0316   HCT 28.3 (L) 06/05/2018 1207   PLT 160 06/06/2018 0316   MCV 75.4 (L) 06/06/2018 0316   MCH 25.9 (L) 06/06/2018 0316   MCHC 34.4 06/06/2018 0316   RDW 13.2 06/06/2018 0316   LYMPHSABS 0.6 (L) 06/04/2018 0957   MONOABS 0.8 06/04/2018 0957   EOSABS 0.1 06/04/2018 0957    BASOSABS 0.0 06/04/2018 0957    Assessment/Plan:  1. AKI/CKD stage 3 (last known Cr was 1.42 in Dec 2017). Oliguric presumably multifactorial; volume depletion due to N/V in setting of concomitant ACE inhibition and diuretics with superimposed rhabdomyolysis. s/p urgent HD on 06/04/18 due to hyperkalemia.  remains oliguric. [place] without hydro but did show increased echogenicity consistent with chronic medical renal disease. 1.  minimal UOP 2. Continue to hold benazepril, hctz, and glucophage 3. Avoid nephrotoxic agents 4.  stopped IVF now as I feel tank is full  CK trending down  5. remains oliguric  6. Went ahead and did HD today as  felt badly and numbers do continue to worsen  vascath placed 69/45- 71 days old .  Continue to follow closely.  Will remove foley cath as feel he could use a urinal  2. Metabolic acidosis- due to #1 and concomitant metformin. Improved with isotonic bicarb- drifting down again , now oral bicarb 3. Rhabdomyolysis- likely contributing to AKI but unclear etiology other than statin therapy. No history of falls or being found down.  follow levels, trending down 4. Anemia- presumably due to CKD, however unknown baseline Cr. Will check SPEP/UPEP, iron stores OK and follow. no tto a critical point 5. SIRS- on zosyn and cultures pending. covid-19 negative  6. DM- per primary svc 7. HTN- low BP's on arrival, meds on hold 8. Elevated AST/ALT- likely due to shock liver but r/o infectious and agree with holding statin   Chad Mathis

## 2018-06-12 LAB — RENAL FUNCTION PANEL
Albumin: 2.2 g/dL — ABNORMAL LOW (ref 3.5–5.0)
Anion gap: 11 (ref 5–15)
BUN: 26 mg/dL — ABNORMAL HIGH (ref 8–23)
CO2: 23 mmol/L (ref 22–32)
Calcium: 7.9 mg/dL — ABNORMAL LOW (ref 8.9–10.3)
Chloride: 104 mmol/L (ref 98–111)
Creatinine, Ser: 6.94 mg/dL — ABNORMAL HIGH (ref 0.61–1.24)
GFR calc Af Amer: 9 mL/min — ABNORMAL LOW (ref >60)
GFR calc non Af Amer: 8 mL/min — ABNORMAL LOW (ref >60)
Glucose, Bld: 88 mg/dL (ref 70–99)
Phosphorus: 4.1 mg/dL (ref 2.5–4.6)
Potassium: 3.4 mmol/L — ABNORMAL LOW (ref 3.5–5.1)
Sodium: 138 mmol/L (ref 135–145)

## 2018-06-12 LAB — GLUCOSE, CAPILLARY
Glucose-Capillary: 75 mg/dL (ref 70–99)
Glucose-Capillary: 82 mg/dL (ref 70–99)
Glucose-Capillary: 87 mg/dL (ref 70–99)
Glucose-Capillary: 96 mg/dL (ref 70–99)

## 2018-06-12 NOTE — Plan of Care (Signed)
  Problem: Clinical Measurements: Goal: Diagnostic test results will improve Outcome: Progressing Goal: Respiratory complications will improve Outcome: Progressing Goal: Cardiovascular complication will be avoided Outcome: Progressing   Problem: Pain Managment: Goal: General experience of comfort will improve Outcome: Progressing   

## 2018-06-12 NOTE — Care Management Important Message (Signed)
Important Message  Patient Details  Name: Chad Mathis MRN: [id number] Date of Birth: 12-12-1955   Medicare Important Message Given:  Yes    Memory Argue 06/12/2018, 3:31 PM

## 2018-06-12 NOTE — Progress Notes (Signed)
Patient ID: Chad Mathis, male   DOB: 16-Apr-1955, 63 y.o.   MRN: [id number] S:  150 UOP recorded -  HD early yesterday removed 1000- no new c/o's   O:BP 108/74 (BP Location: Left Arm)   Pulse 88   Temp 98.6 F (37 C) (Oral)   Resp 14   Wt 66.5 kg   SpO2 97%   BMI 25.97 kg/m   Intake/Output Summary (Last 24 hours) at 06/12/2018 1151 Last data filed at 06/11/2018 1415 Gross per 24 hour  Intake -  Output 150 ml  Net -150 ml   Intake/Output: I/O last 3 completed shifts: In: -  Out: 1200 [Urine:200; Other:1000]  Intake/Output this shift:  No intake/output data recorded. Weight change: -2 kg Gen:NAD CVS: no rub  Resp: cta Abd: benign Ext: dep pitting edema  Recent Labs  Lab 06/06/18 0316 06/07/18 0349 06/08/18 0403 06/09/18 0606 06/10/18 0326 06/11/18 0453 06/12/18 0317  NA 139 140 136 139 139 138 138  K 2.9* 2.9* 3.5 3.9 3.9 4.1 3.4*  CL 98 105 107 108 108 108 104  CO2 24 22 17* 17* 18* 17* 23  GLUCOSE 105* 80 80 95 87 90 88  BUN 30* 33* 33* 38* 43* 50* 26*  CREATININE 5.73* 6.48* 7.15* 7.90* 8.62* 9.75* 6.94*  ALBUMIN 2.5* 2.2* 2.2* 2.3* 2.1* 2.3* 2.2*  CALCIUM 8.0* 7.7* 8.0* 8.5* 8.3* 8.6* 7.9*  PHOS  --   --   --  5.5* 5.6* 5.8* 4.1  AST 213* 215* 242*  --   --   --   --   ALT 162* 161* 193*  --   --   --   --    Liver Function Tests: Recent Labs  Lab 06/06/18 0316 06/07/18 0349 06/08/18 0403  06/10/18 0326 06/11/18 0453 06/12/18 0317  AST 213* 215* 242*  --   --   --   --   ALT 162* 161* 193*  --   --   --   --   ALKPHOS 31* 30* 32*  --   --   --   --   BILITOT 0.7 0.6 0.7  --   --   --   --   PROT 4.9* 4.5* 4.3*  --   --   --   --   ALBUMIN 2.5* 2.2* 2.2*   < > 2.1* 2.3* 2.2*   < > = values in this interval not displayed.   No results for input(s): LIPASE, AMYLASE in the last 168 hours. No results for input(s): AMMONIA in the last 168 hours. CBC: Recent Labs  Lab 06/05/18 1207 06/06/18 0316  WBC  --  6.7  HGB  --  9.8*  HCT 28.3* 28.5*   MCV  --  75.4*  PLT  --  160   Cardiac Enzymes: Recent Labs  Lab 06/05/18 1207 06/06/18 0316 06/07/18 0349 06/08/18 0403 06/11/18 0453  CKTOTAL 12,583* 6,870* 9,870* 11,726* 5,411*   CBG: Recent Labs  Lab 06/11/18 1203 06/11/18 1620 06/11/18 2031 06/12/18 0010 06/12/18 0457  GLUCAP 83 97 85 96 87    Iron Studies:  No results for input(s): IRON, TIBC, TRANSFERRIN, FERRITIN in the last 72 hours. Studies/Results: No results found. Marland Kitchen aspirin  81 mg Oral Daily  . Chlorhexidine Gluconate Cloth  6 each Topical Q0600  . divalproex  125 mg Oral QHS  . ferrous sulfate  325 mg Oral Q breakfast  . fluticasone  1 spray Each Nare Daily  . furosemide  80 mg Intravenous Q12H  . heparin  5,000 Units Subcutaneous Q8H  . insulin aspart  0-9 Units Subcutaneous Q4H  . sodium bicarbonate  650 mg Oral BID  . traZODone  150 mg Oral QHS  . ziprasidone  60 mg Oral Daily    BMET    Component Value Date/Time   NA 138 06/12/2018 0317   K 3.4 (L) 06/12/2018 0317   CL 104 06/12/2018 0317   CO2 23 06/12/2018 0317   GLUCOSE 88 06/12/2018 0317   BUN 26 (H) 06/12/2018 0317   CREATININE 6.94 (H) 06/12/2018 0317   CALCIUM 7.9 (L) 06/12/2018 0317   GFRNONAA 8 (L) 06/12/2018 0317   GFRAA 9 (L) 06/12/2018 0317   CBC    Component Value Date/Time   WBC 6.7 06/06/2018 0316   RBC 3.78 (L) 06/06/2018 0316   HGB 9.8 (L) 06/06/2018 0316   HCT 28.5 (L) 06/06/2018 0316   HCT 28.3 (L) 06/05/2018 1207   PLT 160 06/06/2018 0316   MCV 75.4 (L) 06/06/2018 0316   MCH 25.9 (L) 06/06/2018 0316   MCHC 34.4 06/06/2018 0316   RDW 13.2 06/06/2018 0316   LYMPHSABS 0.6 (L) 06/04/2018 0957   MONOABS 0.8 06/04/2018 0957   EOSABS 0.1 06/04/2018 0957   BASOSABS 0.0 06/04/2018 0957    Assessment/Plan:  1. AKI/CKD stage 3 (last known Cr was 1.42 in Dec 2017). Oliguric presumably multifactorial; volume depletion due to N/V in setting of concomitant ACE inhibition and diuretics with superimposed  rhabdomyolysis. s/p  HD on 06/04/18 due to hyperkalemia.  remained oliguric. [place] without hydro but did show increased echogenicity consistent with chronic medical renal disease. 1.  minimal UOP 2. Continue to hold benazepril, hctz, and glucophage 3. Avoid nephrotoxic agents 4.  stopped IVF now as I feel tank is full  CK finally trending down  5. remains oliguric  6. Went ahead and did HD 6/4 as  felt badly and numbers do continue to worsen  vascath placed 52/16- 56 days old .  Continue to follow closely.   removed foley cath as feel he could use a urinal  2. Metabolic acidosis- better 3. Rhabdomyolysis- likely contributing to AKI but unclear etiology other than statin therapy. No history of falls or being found down.  follow levels, trending down 4. Anemia- presumably due to CKD, however unknown baseline Cr.  SPEP/UPEP neg, iron stores OK and follow. no tto a critical point- has not been checked lately- will check tomorrow  5. SIRS- completed zosyn - covid-19 negative  6. DM- per primary svc 7. HTN- low BP's on arrival, meds on hold 8. Elevated AST/ALT- likely due to shock liver but r/o infectious and agree with holding statin - per primary   Louis Meckel

## 2018-06-12 NOTE — Progress Notes (Signed)
Pt still with no urge to void. Bladder scanned for 51mL. Will continue to monitor.

## 2018-06-12 NOTE — Progress Notes (Signed)
PROGRESS NOTE    Chad Mathis  QQP:[id number] DOB: September 01, 1955 DOA: 06/04/2018 PCP: Verner Chol, MD    Brief Narrative:  63 y.o.malewith medical history significant ofdiabetes, bipolar disorder.Patient reports nausea and vomiting for the last 3 days. The symptoms have worsened. He has not been able to tolerate a diet. He has not taken anything to help with the symptoms. No associated chest pain, shortness of breath, abdominal pain, diarrhea. He reports decreased urine output since Monday.  Assessment & Plan:   Principal Problem:   Acute renal failure (HCC) Active Problems:   Diabetes mellitus ([place])   SIRS (systemic inflammatory response syndrome) (HCC)  Acute on chronic renal failure: Stage III at baseline secondary to diabetes most likely -Patient currently oliguric -Received emergent hemodialysis on 06/04/2018 -Creatinine trended up to 8.62 -continue bicarb BID -Electrolytes remain stable at this time -Nephrology following, fluids are on hold. Pt underwent HD 6/4  SIRS -Currently afebrile and with normal WBC's -COVID-19 neg -Continue to follow final culture results (no growth or microorganism isolated) -Continue monitoring off antibiotics -remains clinically stable at this time.  Nausea/vomiting -Secondary to uremia versus underlying viral gastroenteritis -Continue as needed antiemetics -Continue IV fluids -Cont diet as tolerated  Hyperkalemia/hypokalemia -In the setting of acute renal failure -Improved and resolved after hemodialysis -Patient also receive bicarbonate, Lokelma and calcium gluconate at time of admission. -Continue to follow electrolytes and further replete as needed.     Type II diabetes mellitus with nephropathy -Continue sliding scale insulin -Follow CBGs and adjust hypoglycemic regimen as needed -Continue holding oral hypoglycemic agents. -Glucose trends remain stable  Bipolar disorder/schizophrenia -Continue Depakote and  Geodon -No recent suicidal ideation or hallucinations noted  Elevated transaminitis -No prior history of liver disease -Continue holding statins -Follow LFTs trend -Most likely associated with shock liver and rhabdomyolysis. -CK trended down  Essential HTN -continue holding antihypertensive agents) -Stable at this time  Urinary retention -Continue the use of Foley catheter. -Follow I's and O's  Insomnia -will continue the use of Restoril as needed.   DVT prophylaxis: Heparin subQ Code Status: Full Family Communication: Pt in room, family not at bedside Disposition Plan: Uncertain at this time  Consultants:   Nephrology  Procedures:     Antimicrobials: Anti-infectives (From admission, onward)   Start     Dose/Rate Route Frequency Ordered Stop   06/04/18 2200  piperacillin-tazobactam (ZOSYN) IVPB 2.25 g  Status:  Discontinued     2.25 g 100 mL/hr over 30 Minutes Intravenous Every 8 hours 06/04/18 1250 06/06/18 1846   06/04/18 1300  piperacillin-tazobactam (ZOSYN) IVPB 3.375 g     3.375 g 100 mL/hr over 30 Minutes Intravenous  Once 06/04/18 1250 06/04/18 1432   06/04/18 1100  cefTRIAXone (ROCEPHIN) 1 g in sodium chloride 0.9 % 100 mL IVPB     1 g 200 mL/hr over 30 Minutes Intravenous  Once 06/04/18 1054 06/04/18 1215      Subjective: No complaints at this time  Objective: Vitals:   06/12/18 0338 06/12/18 0500 06/12/18 0700 06/12/18 1100  BP: (!) 88/62 108/74    Pulse: 75 88 86 74  Resp: 14 14 18 14   Temp: 98.6 F (37 C) 98.6 F (37 C)    TempSrc: Oral Oral    SpO2: 94% 97% 95% 97%  Weight:       No intake or output data in the 24 hours ending 06/12/18 1444 Filed Weights   06/04/18 2056 06/11/18 0642 06/11/18 0949  Weight: 56.7 kg 68.5 kg  66.5 kg    Examination: General exam: Awake, laying in bed, in nad Respiratory system: Normal respiratory effort, no wheezing  Data Reviewed: I have personally reviewed following labs and imaging studies   CBC: Recent Labs  Lab 06/06/18 0316  WBC 6.7  HGB 9.8*  HCT 28.5*  MCV 75.4*  PLT 170   Basic Metabolic Panel: Recent Labs  Lab 06/08/18 0403 06/09/18 0606 06/10/18 0326 06/11/18 0453 06/12/18 0317  NA 136 139 139 138 138  K 3.5 3.9 3.9 4.1 3.4*  CL 107 108 108 108 104  CO2 17* 17* 18* 17* 23  GLUCOSE 80 95 87 90 88  BUN 33* 38* 43* 50* 26*  CREATININE 7.15* 7.90* 8.62* 9.75* 6.94*  CALCIUM 8.0* 8.5* 8.3* 8.6* 7.9*  PHOS  --  5.5* 5.6* 5.8* 4.1   GFR: CrCl cannot be calculated (Unknown ideal weight.). Liver Function Tests: Recent Labs  Lab 06/06/18 0316 06/07/18 0349 06/08/18 0403 06/09/18 0606 06/10/18 0326 06/11/18 0453 06/12/18 0317  AST 213* 215* 242*  --   --   --   --   ALT 162* 161* 193*  --   --   --   --   ALKPHOS 31* 30* 32*  --   --   --   --   BILITOT 0.7 0.6 0.7  --   --   --   --   PROT 4.9* 4.5* 4.3*  --   --   --   --   ALBUMIN 2.5* 2.2* 2.2* 2.3* 2.1* 2.3* 2.2*   No results for input(s): LIPASE, AMYLASE in the last 168 hours. No results for input(s): AMMONIA in the last 168 hours. Coagulation Profile: No results for input(s): INR, PROTIME in the last 168 hours. Cardiac Enzymes: Recent Labs  Lab 06/06/18 0316 06/07/18 0349 06/08/18 0403 06/11/18 0453  CKTOTAL 6,870* 9,870* 11,726* 5,411*   BNP (last 3 results) No results for input(s): PROBNP in the last 8760 hours. HbA1C: No results for input(s): HGBA1C in the last 72 hours. CBG: Recent Labs  Lab 06/11/18 1203 06/11/18 1620 06/11/18 2031 06/12/18 0010 06/12/18 0457  GLUCAP 83 97 85 96 87   Lipid Profile: No results for input(s): CHOL, HDL, LDLCALC, TRIG, CHOLHDL, LDLDIRECT in the last 72 hours. Thyroid Function Tests: No results for input(s): TSH, T4TOTAL, FREET4, T3FREE, THYROIDAB in the last 72 hours. Anemia Panel: No results for input(s): VITAMINB12, FOLATE, FERRITIN, TIBC, IRON, RETICCTPCT in the last 72 hours. Sepsis Labs: Recent Labs  Lab 06/06/18 0316   PROCALCITON 1.20    Recent Results (from the past 240 hour(s))  Blood Culture (routine x 2)     Status: None   Collection Time: 06/04/18  9:57 AM  Result Value Ref Range Status   Specimen Description   Final    BLOOD RIGHT HAND Performed at [facility] [address] [place], [place] [address]    Special Requests   Final    BOTTLES DRAWN AEROBIC ONLY Blood Culture adequate volume Performed at [facility] [address], [place], [place] [address]    Culture   Final    NO GROWTH 5 DAYS Performed at [facility], [place] [address], [place], [place] [address]    Report Status 06/09/2018 FINAL  Final  Blood Culture (routine x 2)     Status: None   Collection Time: 06/04/18 10:00 AM  Result Value Ref Range Status   Specimen Description   Final  BLOOD LEFT HAND Performed at [facility] [address] [place], [place] [address]    Special Requests   Final    BOTTLES DRAWN AEROBIC AND ANAEROBIC Blood Culture results may not be optimal due to an inadequate volume of blood received in culture bottles Performed at [facility] [address], [place], [place] [address]    Culture   Final    NO GROWTH 5 DAYS Performed at [facility], [place] [address] [place], [place] [address]    Report Status 06/09/2018 FINAL  Final  SARS Coronavirus 2 (CEPHEID - Performed in [facility] hospital lab), Hosp Order     Status: None   Collection Time: 06/04/18 10:00 AM  Result Value Ref Range Status   SARS Coronavirus 2 NEGATIVE NEGATIVE Final    Comment: (NOTE) If result is NEGATIVE SARS-CoV-2 target nucleic acids are NOT DETECTED. The SARS-CoV-2 RNA is generally detectable in upper and lower  respiratory specimens during the acute phase of infection. The lowest  concentration of SARS-CoV-2 viral copies this assay can detect is 250  copies / mL. A negative result does not preclude SARS-CoV-2 infection   and should not be used as the sole basis for treatment or other  patient management decisions.  A negative result may occur with  improper specimen collection / handling, submission of specimen other  than nasopharyngeal swab, presence of viral mutation(s) within the  areas targeted by this assay, and inadequate number of viral copies  (<250 copies / mL). A negative result must be combined with clinical  observations, patient history, and epidemiological information. If result is POSITIVE SARS-CoV-2 target nucleic acids are DETECTED. The SARS-CoV-2 RNA is generally detectable in upper and lower  respiratory specimens dur ing the acute phase of infection.  Positive  results are indicative of active infection with SARS-CoV-2.  Clinical  correlation with patient history and other diagnostic information is  necessary to determine patient infection status.  Positive results do  not rule out bacterial infection or co-infection with other viruses. If result is PRESUMPTIVE POSTIVE SARS-CoV-2 nucleic acids MAY BE PRESENT.   A presumptive positive result was obtained on the submitted specimen  and confirmed on repeat testing.  While 2019 novel coronavirus  (SARS-CoV-2) nucleic acids may be present in the submitted sample  additional confirmatory testing may be necessary for epidemiological  and / or clinical management purposes  to differentiate between  SARS-CoV-2 and other Sarbecovirus currently known to infect humans.  If clinically indicated additional testing with an alternate test  methodology [phone]) is advised. The SARS-CoV-2 RNA is generally  detectable in upper and lower respiratory sp ecimens during the acute  phase of infection. The expected result is Negative. Fact Sheet for Patients:  StrictlyIdeas.no Fact Sheet for Healthcare Providers: BankingDealers.co.za This test is not yet approved or cleared by the [place] FDA and has  been authorized for detection and/or diagnosis of SARS-CoV-2 by FDA under an Emergency Use Authorization (EUA).  This EUA will remain in effect (meaning this test can be used) for the duration of the COVID-19 declaration under Section 564(b)(1) of the Act, 21 U.S.C. section 360bbb-3(b)(1), unless the authorization is terminated or revoked sooner. Performed at [facility], [place] [address] [place], [place] [address]   Urine Culture     Status: None   Collection Time: 06/06/18 10:42 AM  Result Value Ref Range Status   Specimen Description URINE, CATHETERIZED  Final   Special Requests  NONE  Final   Culture   Final    NO GROWTH Performed at [facility], [place] [address], [place], [place] [address]    Report Status 06/07/2018 FINAL  Final  MRSA PCR Screening     Status: None   Collection Time: 06/06/18  1:50 PM  Result Value Ref Range Status   MRSA by PCR NEGATIVE NEGATIVE Final    Comment:        The GeneXpert MRSA Assay (FDA approved for NASAL specimens only), is one component of a comprehensive MRSA colonization surveillance program. It is not intended to diagnose MRSA infection nor to guide or monitor treatment for MRSA infections. Performed at [facility], [place] [address] [place], [place] [address]      Radiology Studies: No results found.  Scheduled Meds: . aspirin  81 mg Oral Daily  . Chlorhexidine Gluconate Cloth  6 each Topical Q0600  . divalproex  125 mg Oral QHS  . ferrous sulfate  325 mg Oral Q breakfast  . fluticasone  1 spray Each Nare Daily  . furosemide  80 mg Intravenous Q12H  . heparin  5,000 Units Subcutaneous Q8H  . insulin aspart  0-9 Units Subcutaneous Q4H  . sodium bicarbonate  650 mg Oral BID  . traZODone  150 mg Oral QHS  . ziprasidone  60 mg Oral Daily   Continuous Infusions: . sodium chloride       LOS: 8 days   Marylu Lund, MD [facility] Pager On Amion  If 7PM-7AM, please contact  night-coverage 06/12/2018, 2:44 PM

## 2018-06-12 NOTE — Progress Notes (Signed)
Pt had not void since foley was D/C.  He stated that he has no urge to void.  Bladder scan show 12 ml.  Will continue to monitor.

## 2018-06-13 LAB — RENAL FUNCTION PANEL
Albumin: 2.4 g/dL — ABNORMAL LOW (ref 3.5–5.0)
Anion gap: 12 (ref 5–15)
BUN: 32 mg/dL — ABNORMAL HIGH (ref 8–23)
CO2: 22 mmol/L (ref 22–32)
Calcium: 8.2 mg/dL — ABNORMAL LOW (ref 8.9–10.3)
Chloride: 104 mmol/L (ref 98–111)
Creatinine, Ser: 8.09 mg/dL — ABNORMAL HIGH (ref 0.61–1.24)
GFR calc Af Amer: 7 mL/min — ABNORMAL LOW (ref >60)
GFR calc non Af Amer: 6 mL/min — ABNORMAL LOW (ref >60)
Glucose, Bld: 88 mg/dL (ref 70–99)
Phosphorus: 4.7 mg/dL — ABNORMAL HIGH (ref 2.5–4.6)
Potassium: 3.6 mmol/L (ref 3.5–5.1)
Sodium: 138 mmol/L (ref 135–145)

## 2018-06-13 LAB — GLUCOSE, CAPILLARY
Glucose-Capillary: 82 mg/dL (ref 70–99)
Glucose-Capillary: 83 mg/dL (ref 70–99)
Glucose-Capillary: 76 mg/dL (ref 70–99)
Glucose-Capillary: 81 mg/dL (ref 70–99)
Glucose-Capillary: 80 mg/dL (ref 70–99)
Glucose-Capillary: 87 mg/dL (ref 70–99)

## 2018-06-13 LAB — CBC
HCT: 25.5 % — ABNORMAL LOW (ref 39.0–52.0)
Hemoglobin: 8.1 g/dL — ABNORMAL LOW (ref 13.0–17.0)
MCH: 24.9 pg — ABNORMAL LOW (ref 26.0–34.0)
MCHC: 31.8 g/dL (ref 30.0–36.0)
MCV: 78.5 fL — ABNORMAL LOW (ref 80.0–100.0)
Platelets: 264 10*3/uL (ref 150–400)
RBC: 3.25 MIL/uL — ABNORMAL LOW (ref 4.22–5.81)
RDW: 13.1 % (ref 11.5–15.5)
WBC: 5.9 10*3/uL (ref 4.0–10.5)
nRBC: 0 % (ref 0.0–0.2)

## 2018-06-13 MED ORDER — DARBEPOETIN ALFA 100 MCG/0.5ML IJ SOSY
100.0000 ug | PREFILLED_SYRINGE | INTRAMUSCULAR | Status: DC
  Administered 2018-06-13: 100 ug via SUBCUTANEOUS
  Filled 2018-06-13 (×2): qty 0.5

## 2018-06-13 NOTE — Progress Notes (Signed)
Patient ID: Chad Mathis, male   DOB: [birth year], 63 y.o.   MRN: [id number] S:  no UOP recorded -   no new c/o's   O:BP 123/76 (BP Location: Left Arm)   Pulse 78   Temp 97.9 F (36.6 C) (Oral)   Resp 17   Wt 66.5 kg   SpO2 99%   BMI 25.97 kg/m   Intake/Output Summary (Last 24 hours) at 06/13/2018 1141 Last data filed at 06/13/2018 0931 Gross per 24 hour  Intake 40 ml  Output -  Net 40 ml   Intake/Output: No intake/output data recorded.  Intake/Output this shift:  Total I/O In: 40 [P.O.:40] Out: -  Weight change:  Gen:NAD CVS: no rub  Resp: cta Abd: benign Ext: dep pitting edema  Recent Labs  Lab 06/07/18 0349 06/08/18 0403 06/09/18 0606 06/10/18 0326 06/11/18 0453 06/12/18 0317 06/13/18 0411  NA 140 136 139 139 138 138 138  K 2.9* 3.5 3.9 3.9 4.1 3.4* 3.6  CL 105 107 108 108 108 104 104  CO2 22 17* 17* 18* 17* 23 22  GLUCOSE 80 80 95 87 90 88 88  BUN 33* 33* 38* 43* 50* 26* 32*  CREATININE 6.48* 7.15* 7.90* 8.62* 9.75* 6.94* 8.09*  ALBUMIN 2.2* 2.2* 2.3* 2.1* 2.3* 2.2* 2.4*  CALCIUM 7.7* 8.0* 8.5* 8.3* 8.6* 7.9* 8.2*  PHOS  --   --  5.5* 5.6* 5.8* 4.1 4.7*  AST 215* 242*  --   --   --   --   --   ALT 161* 193*  --   --   --   --   --    Liver Function Tests: Recent Labs  Lab 06/07/18 0349 06/08/18 0403  06/11/18 0453 06/12/18 0317 06/13/18 0411  AST 215* 242*  --   --   --   --   ALT 161* 193*  --   --   --   --   ALKPHOS 30* 32*  --   --   --   --   BILITOT 0.6 0.7  --   --   --   --   PROT 4.5* 4.3*  --   --   --   --   ALBUMIN 2.2* 2.2*   < > 2.3* 2.2* 2.4*   < > = values in this interval not displayed.   No results for input(s): LIPASE, AMYLASE in the last 168 hours. No results for input(s): AMMONIA in the last 168 hours. CBC: Recent Labs  Lab 06/13/18 0411  WBC 5.9  HGB 8.1*  HCT 25.5*  MCV 78.5*  PLT 264   Cardiac Enzymes: Recent Labs  Lab 06/07/18 0349 06/08/18 0403 06/11/18 0453  CKTOTAL 9,870* 11,726* 5,411*    CBG: Recent Labs  Lab 06/12/18 1720 06/12/18 2100 06/13/18 0105 06/13/18 0409 06/13/18 0820  GLUCAP 82 75 82 83 76    Iron Studies:  No results for input(s): IRON, TIBC, TRANSFERRIN, FERRITIN in the last 72 hours. Studies/Results: No results found. Marland Kitchen aspirin  81 mg Oral Daily  . Chlorhexidine Gluconate Cloth  6 each Topical Q0600  . divalproex  125 mg Oral QHS  . ferrous sulfate  325 mg Oral Q breakfast  . fluticasone  1 spray Each Nare Daily  . furosemide  80 mg Intravenous Q12H  . heparin  5,000 Units Subcutaneous Q8H  . insulin aspart  0-9 Units Subcutaneous Q4H  . sodium bicarbonate  650 mg Oral BID  . traZODone  150  mg Oral QHS  . ziprasidone  60 mg Oral Daily    BMET    Component Value Date/Time   NA 138 06/13/2018 0411   K 3.6 06/13/2018 0411   CL 104 06/13/2018 0411   CO2 22 06/13/2018 0411   GLUCOSE 88 06/13/2018 0411   BUN 32 (H) 06/13/2018 0411   CREATININE 8.09 (H) 06/13/2018 0411   CALCIUM 8.2 (L) 06/13/2018 0411   GFRNONAA 6 (L) 06/13/2018 0411   GFRAA 7 (L) 06/13/2018 0411   CBC    Component Value Date/Time   WBC 5.9 06/13/2018 0411   RBC 3.25 (L) 06/13/2018 0411   HGB 8.1 (L) 06/13/2018 0411   HCT 25.5 (L) 06/13/2018 0411   HCT 28.3 (L) 06/05/2018 1207   PLT 264 06/13/2018 0411   MCV 78.5 (L) 06/13/2018 0411   MCH 24.9 (L) 06/13/2018 0411   MCHC 31.8 06/13/2018 0411   RDW 13.1 06/13/2018 0411   LYMPHSABS 0.6 (L) 06/04/2018 0957   MONOABS 0.8 06/04/2018 0957   EOSABS 0.1 06/04/2018 0957   BASOSABS 0.0 06/04/2018 0957    Assessment/Plan:  1. AKI/CKD stage 3 (last known Cr was 1.42 in Dec 2017). Oliguric presumably multifactorial; volume depletion due to N/V in setting of concomitant ACE inhibition and diuretics with superimposed rhabdomyolysis. s/p  HD on 06/04/18 due to hyperkalemia. [place] without hydro but did show increased echogenicity consistent with chronic medical renal disease.  1. Continued to hold benazepril, hctz, and  glucophage 2. Avoid nephrotoxic agents 3.  stopped IVF now as I felt tank was full  CK finally trending down  4. remains oliguric  5. Did HD 6/4 as felt badly and numbers do continue to worsen off HD  vascath placed 102/52- 38 days old .  Continue to follow closely.   removed foley cath as feel he could use a urinal.  No dialysis indications today but no urine makes it likely he will need again at some point 2. Metabolic acidosis- better 3. Rhabdomyolysis- likely contributing to AKI but unclear etiology other than statin therapy. No history of falls or being found down.  follow levels, trending down 4. Anemia- presumably due to CKD, however unknown baseline Cr.  SPEP/UPEP neg, iron stores OK and follow. no to a critical point- has not been checked- 8.1 today - will give darbe  5. SIRS- completed zosyn - covid-19 negative  6. DM- per primary svc 7. HTN- low BP's on arrival, meds on hold 8. Elevated AST/ALT- likely due to shock liver but r/o infectious and agree with holding statin - per primary   Louis Meckel

## 2018-06-13 NOTE — Progress Notes (Signed)
Patient consumed 50% of dinner tray. Bladder scanned for 126. Denies an urge to void. Urinal remains within reach. Will continue to monitor.   Hiram Comber, RN 06/13/2018 6:14 PM

## 2018-06-13 NOTE — Progress Notes (Signed)
Patient has had poor appetite throughout shift. 0% consumed for breakfast; 25% consumed for lunch. Patient educated about importance of eating but denies having an appetite. Also denies any N/V. Will continue to monitor.    Hiram Comber, RN 06/13/2018 4:33 PM

## 2018-06-13 NOTE — Progress Notes (Signed)
Patient with no urine output. Bladder scan performed for 83. Denies urge to urination. Will continue to monitor.  Hiram Comber, RN 06/13/2018 10:59 AM

## 2018-06-13 NOTE — Progress Notes (Signed)
PROGRESS NOTE    Chad Mathis  OVZ:[id number] DOB: [birth year] DOA: 06/04/2018 PCP: Verner Chol, MD    Brief Narrative:  63 y.o.malewith medical history significant ofdiabetes, bipolar disorder.Patient reports nausea and vomiting for the last 3 days. The symptoms have worsened. He has not been able to tolerate a diet. He has not taken anything to help with the symptoms. No associated chest pain, shortness of breath, abdominal pain, diarrhea. He reports decreased urine output since Monday.  Assessment & Plan:   Principal Problem:   Acute renal failure (HCC) Active Problems:   Diabetes mellitus ([place])   SIRS (systemic inflammatory response syndrome) (HCC)  Acute on chronic renal failure: Stage III at baseline secondary to diabetes most likely -Patient currently oliguric -Received emergent hemodialysis on 06/04/2018 -Creatinine trended up to 8.62 -continue bicarb BID -Electrolytes remain stable at this time -Nephrology continues to follow. No HD today. Still with minimal urine output  SIRS -Currently afebrile and with normal WBC's -COVID-19 neg -Continue to follow final culture results (no growth or microorganism isolated) -Continue monitoring off antibiotics -remains clinically stable at this time.  Nausea/vomiting -Secondary to uremia versus underlying viral gastroenteritis -Continue as needed antiemetics -Continue IV fluids -Cont diet as tolerated  Hyperkalemia/hypokalemia -In the setting of acute renal failure -Improved and resolved after hemodialysis -Patient also receive bicarbonate, Lokelma and calcium gluconate at time of admission. -Continue to follow electrolytes and further replete as needed.     Type II diabetes mellitus with nephropathy -Continue sliding scale insulin -Follow CBGs and adjust hypoglycemic regimen as needed -Continue holding oral hypoglycemic agents. -Glucose trends remain stable  Bipolar disorder/schizophrenia -Continue  Depakote and Geodon -No recent suicidal ideation or hallucinations noted  Elevated transaminitis -No prior history of liver disease -Continue holding statins -Follow LFTs trend -Most likely associated with shock liver and rhabdomyolysis. -CK trended down  Essential HTN -continue holding antihypertensive agents) -Stable at this time  Urinary retention -Continue the use of Foley catheter. -Follow I's and O's  Insomnia -will continue the use of Restoril as needed.   DVT prophylaxis: Heparin subQ Code Status: Full Family Communication: Pt in room, family not at bedside Disposition Plan: Uncertain at this time  Consultants:   Nephrology  Procedures:     Antimicrobials: Anti-infectives (From admission, onward)   Start     Dose/Rate Route Frequency Ordered Stop   06/04/18 2200  piperacillin-tazobactam (ZOSYN) IVPB 2.25 g  Status:  Discontinued     2.25 g 100 mL/hr over 30 Minutes Intravenous Every 8 hours 06/04/18 1250 06/06/18 1846   06/04/18 1300  piperacillin-tazobactam (ZOSYN) IVPB 3.375 g     3.375 g 100 mL/hr over 30 Minutes Intravenous  Once 06/04/18 1250 06/04/18 1432   06/04/18 1100  cefTRIAXone (ROCEPHIN) 1 g in sodium chloride 0.9 % 100 mL IVPB     1 g 200 mL/hr over 30 Minutes Intravenous  Once 06/04/18 1054 06/04/18 1215      Subjective: Without complaints  Objective: Vitals:   06/13/18 0107 06/13/18 0339 06/13/18 0411 06/13/18 0932  BP:  108/68  123/76  Pulse:  76  78  Resp:  16  17  Temp: 97.7 F (36.5 C)  97.7 F (36.5 C) 97.9 F (36.6 C)  TempSrc: Oral  Oral Oral  SpO2:  99%  99%  Weight:        Intake/Output Summary (Last 24 hours) at 06/13/2018 1628 Last data filed at 06/13/2018 0931 Gross per 24 hour  Intake 40 ml  Output -  Net 40 ml   Filed Weights   06/04/18 2056 06/11/18 0642 06/11/18 0949  Weight: 56.7 kg 68.5 kg 66.5 kg    Examination: General exam: Conversant, in no acute distress Respiratory system: normal chest  rise, clear, no audible wheezing  Data Reviewed: I have personally reviewed following labs and imaging studies  CBC: Recent Labs  Lab 06/13/18 0411  WBC 5.9  HGB 8.1*  HCT 25.5*  MCV 78.5*  PLT 585   Basic Metabolic Panel: Recent Labs  Lab 06/09/18 0606 06/10/18 0326 06/11/18 0453 06/12/18 0317 06/13/18 0411  NA 139 139 138 138 138  K 3.9 3.9 4.1 3.4* 3.6  CL 108 108 108 104 104  CO2 17* 18* 17* 23 22  GLUCOSE 95 87 90 88 88  BUN 38* 43* 50* 26* 32*  CREATININE 7.90* 8.62* 9.75* 6.94* 8.09*  CALCIUM 8.5* 8.3* 8.6* 7.9* 8.2*  PHOS 5.5* 5.6* 5.8* 4.1 4.7*   GFR: CrCl cannot be calculated (Unknown ideal weight.). Liver Function Tests: Recent Labs  Lab 06/07/18 0349 06/08/18 0403 06/09/18 0606 06/10/18 0326 06/11/18 0453 06/12/18 0317 06/13/18 0411  AST 215* 242*  --   --   --   --   --   ALT 161* 193*  --   --   --   --   --   ALKPHOS 30* 32*  --   --   --   --   --   BILITOT 0.6 0.7  --   --   --   --   --   PROT 4.5* 4.3*  --   --   --   --   --   ALBUMIN 2.2* 2.2* 2.3* 2.1* 2.3* 2.2* 2.4*   No results for input(s): LIPASE, AMYLASE in the last 168 hours. No results for input(s): AMMONIA in the last 168 hours. Coagulation Profile: No results for input(s): INR, PROTIME in the last 168 hours. Cardiac Enzymes: Recent Labs  Lab 06/07/18 0349 06/08/18 0403 06/11/18 0453  CKTOTAL 9,870* 11,726* 5,411*   BNP (last 3 results) No results for input(s): PROBNP in the last 8760 hours. HbA1C: No results for input(s): HGBA1C in the last 72 hours. CBG: Recent Labs  Lab 06/12/18 2100 06/13/18 0105 06/13/18 0409 06/13/18 0820 06/13/18 1212  GLUCAP 75 82 83 76 81   Lipid Profile: No results for input(s): CHOL, HDL, LDLCALC, TRIG, CHOLHDL, LDLDIRECT in the last 72 hours. Thyroid Function Tests: No results for input(s): TSH, T4TOTAL, FREET4, T3FREE, THYROIDAB in the last 72 hours. Anemia Panel: No results for input(s): VITAMINB12, FOLATE, FERRITIN, TIBC,  IRON, RETICCTPCT in the last 72 hours. Sepsis Labs: No results for input(s): PROCALCITON, LATICACIDVEN in the last 168 hours.  Recent Results (from the past 240 hour(s))  Blood Culture (routine x 2)     Status: None   Collection Time: 06/04/18  9:57 AM  Result Value Ref Range Status   Specimen Description   Final    BLOOD RIGHT HAND Performed at [facility] [address] [place], [place] [address]    Special Requests   Final    BOTTLES DRAWN AEROBIC ONLY Blood Culture adequate volume Performed at [facility] [address], [place], [place] [address]    Culture   Final    NO GROWTH 5 DAYS Performed at [facility], [place] [address], [place], [place] [address]    Report Status 06/09/2018 FINAL  Final  Blood Culture (routine x 2)  Status: None   Collection Time: 06/04/18 10:00 AM  Result Value Ref Range Status   Specimen Description   Final    BLOOD LEFT HAND Performed at [facility], [place] [address] [place], [place] [address]    Special Requests   Final    BOTTLES DRAWN AEROBIC AND ANAEROBIC Blood Culture results may not be optimal due to an inadequate volume of blood received in culture bottles Performed at [facility] [address], [place], [place] [address]    Culture   Final    NO GROWTH 5 DAYS Performed at [facility], [place] [address] [place], [place] [address]    Report Status 06/09/2018 FINAL  Final  SARS Coronavirus 2 (CEPHEID - Performed in [place] [facility] hospital lab), Hosp Order     Status: None   Collection Time: 06/04/18 10:00 AM  Result Value Ref Range Status   SARS Coronavirus 2 NEGATIVE NEGATIVE Final    Comment: (NOTE) If result is NEGATIVE SARS-CoV-2 target nucleic acids are NOT DETECTED. The SARS-CoV-2 RNA is generally detectable in upper and lower  respiratory specimens during the acute phase of infection. The lowest  concentration of SARS-CoV-2 viral  copies this assay can detect is 250  copies / mL. A negative result does not preclude SARS-CoV-2 infection  and should not be used as the sole basis for treatment or other  patient management decisions.  A negative result may occur with  improper specimen collection / handling, submission of specimen other  than nasopharyngeal swab, presence of viral mutation(s) within the  areas targeted by this assay, and inadequate number of viral copies  (<250 copies / mL). A negative result must be combined with clinical  observations, patient history, and epidemiological information. If result is POSITIVE SARS-CoV-2 target nucleic acids are DETECTED. The SARS-CoV-2 RNA is generally detectable in upper and lower  respiratory specimens dur ing the acute phase of infection.  Positive  results are indicative of active infection with SARS-CoV-2.  Clinical  correlation with patient history and other diagnostic information is  necessary to determine patient infection status.  Positive results do  not rule out bacterial infection or co-infection with other viruses. If result is PRESUMPTIVE POSTIVE SARS-CoV-2 nucleic acids MAY BE PRESENT.   A presumptive positive result was obtained on the submitted specimen  and confirmed on repeat testing.  While 2019 novel coronavirus  (SARS-CoV-2) nucleic acids may be present in the submitted sample  additional confirmatory testing may be necessary for epidemiological  and / or clinical management purposes  to differentiate between  SARS-CoV-2 and other Sarbecovirus currently known to infect humans.  If clinically indicated additional testing with an alternate test  methodology [phone]) is advised. The SARS-CoV-2 RNA is generally  detectable in upper and lower respiratory sp ecimens during the acute  phase of infection. The expected result is Negative. Fact Sheet for Patients:  StrictlyIdeas.no Fact Sheet for Healthcare Providers:  BankingDealers.co.za This test is not yet approved or cleared by the [place] FDA and has been authorized for detection and/or diagnosis of SARS-CoV-2 by FDA under an Emergency Use Authorization (EUA).  This EUA will remain in effect (meaning this test can be used) for the duration of the COVID-19 declaration under Section 564(b)(1) of the Act, 21 U.S.C. section 360bbb-3(b)(1), unless the authorization is terminated or revoked sooner. Performed at [facility], [place] [address], [place], [place] [address]   Urine Culture     Status: None  Collection Time: 06/06/18 10:42 AM  Result Value Ref Range Status   Specimen Description URINE, CATHETERIZED  Final   Special Requests NONE  Final   Culture   Final    NO GROWTH Performed at [facility], [address], [place], [place] [address]    Report Status 06/07/2018 FINAL  Final  MRSA PCR Screening     Status: None   Collection Time: 06/06/18  1:50 PM  Result Value Ref Range Status   MRSA by PCR NEGATIVE NEGATIVE Final    Comment:        The GeneXpert MRSA Assay (FDA approved for NASAL specimens only), is one component of a comprehensive MRSA colonization surveillance program. It is not intended to diagnose MRSA infection nor to guide or monitor treatment for MRSA infections. Performed at [facility], [place] [address], [place],  [address]      Radiology Studies: No results found.  Scheduled Meds: . aspirin  81 mg Oral Daily  . Chlorhexidine Gluconate Cloth  6 each Topical Q0600  . darbepoetin (ARANESP) injection - NON-DIALYSIS  100 mcg Subcutaneous Q Sat-1800  . divalproex  125 mg Oral QHS  . ferrous sulfate  325 mg Oral Q breakfast  . fluticasone  1 spray Each Nare Daily  . furosemide  80 mg Intravenous Q12H  . heparin  5,000 Units Subcutaneous Q8H  . insulin aspart  0-9 Units Subcutaneous Q4H  . sodium bicarbonate  650 mg Oral BID  . traZODone  150 mg Oral  QHS  . ziprasidone  60 mg Oral Daily   Continuous Infusions: . sodium chloride       LOS: 9 days   Marylu Lund, MD [facility] Pager On Amion  If 7PM-7AM, please contact night-coverage 06/13/2018, 4:28 PM

## 2018-06-14 LAB — RENAL FUNCTION PANEL
Albumin: 2.4 g/dL — ABNORMAL LOW (ref 3.5–5.0)
Anion gap: 14 (ref 5–15)
BUN: 40 mg/dL — ABNORMAL HIGH (ref 8–23)
CO2: 21 mmol/L — ABNORMAL LOW (ref 22–32)
Calcium: 8.4 mg/dL — ABNORMAL LOW (ref 8.9–10.3)
Chloride: 103 mmol/L (ref 98–111)
Creatinine, Ser: 9.08 mg/dL — ABNORMAL HIGH (ref 0.61–1.24)
GFR calc Af Amer: 6 mL/min — ABNORMAL LOW (ref >60)
GFR calc non Af Amer: 6 mL/min — ABNORMAL LOW (ref >60)
Glucose, Bld: 91 mg/dL (ref 70–99)
Phosphorus: 5.5 mg/dL — ABNORMAL HIGH (ref 2.5–4.6)
Potassium: 3.4 mmol/L — ABNORMAL LOW (ref 3.5–5.1)
Sodium: 138 mmol/L (ref 135–145)

## 2018-06-14 LAB — GLUCOSE, CAPILLARY
Glucose-Capillary: 103 mg/dL — ABNORMAL HIGH (ref 70–99)
Glucose-Capillary: 127 mg/dL — ABNORMAL HIGH (ref 70–99)
Glucose-Capillary: 75 mg/dL (ref 70–99)
Glucose-Capillary: 77 mg/dL (ref 70–99)
Glucose-Capillary: 82 mg/dL (ref 70–99)
Glucose-Capillary: 91 mg/dL (ref 70–99)

## 2018-06-14 MED ORDER — CHLORHEXIDINE GLUCONATE CLOTH 2 % EX PADS
6.0000 | MEDICATED_PAD | Freq: Every day | CUTANEOUS | Status: DC
  Administered 2018-06-15 – 2018-06-20 (×5): 6 via TOPICAL

## 2018-06-14 NOTE — Progress Notes (Signed)
PROGRESS NOTE    Chad Mathis  KNL:[id number] DOB: [birth year] DOA: 06/04/2018 PCP: Verner Chol, MD    Brief Narrative:  63 y.o.malewith medical history significant ofdiabetes, bipolar disorder.Patient reports nausea and vomiting for the last 3 days. The symptoms have worsened. He has not been able to tolerate a diet. He has not taken anything to help with the symptoms. No associated chest pain, shortness of breath, abdominal pain, diarrhea. He reports decreased urine output since Monday.  Assessment & Plan:   Principal Problem:   Acute renal failure (HCC) Active Problems:   Diabetes mellitus (HCC)   SIRS (systemic inflammatory response syndrome) (HCC)  Acute on chronic renal failure: Stage III at baseline secondary to diabetes most likely -Patient currently oliguric -Received emergent hemodialysis on 06/04/2018 -Creatinine trended up to 8.62 -continue bicarb BID -Electrolytes remain stable at this time -Nephrology is following  SIRS -Currently afebrile and with normal WBC's -COVID-19 neg -Continue to follow final culture results (no growth or microorganism isolated) -Continue monitoring off antibiotics -remains clinically stable at this time.  Nausea/vomiting -Secondary to uremia versus underlying viral gastroenteritis -Continue as needed antiemetics -Continue IV fluids -Cont diet as tolerated  Hyperkalemia/hypokalemia -In the setting of acute renal failure -Improved and resolved after hemodialysis -Patient also receive bicarbonate, Lokelma and calcium gluconate at time of admission. -Continue to follow electrolytes and further replete as needed.     Type II diabetes mellitus with nephropathy -Continue sliding scale insulin -Follow CBGs and adjust hypoglycemic regimen as needed -Continue holding oral hypoglycemic agents. -Glucose trends remain stable  Bipolar disorder/schizophrenia -Continue Depakote and Geodon -No recent suicidal ideation or  hallucinations noted  Elevated transaminitis -No prior history of liver disease -Continue holding statins -Follow LFTs trend -Most likely associated with shock liver and rhabdomyolysis. -CK trended down  Essential HTN -continue holding antihypertensive agents) -Stable at this time  Urinary retention -Continue the use of Foley catheter. -Follow I's and O's  Insomnia -will continue the use of Restoril as needed.   DVT prophylaxis: Heparin subQ Code Status: Full Family Communication: Pt in room, family not at bedside Disposition Plan: Uncertain at this time  Consultants:   Nephrology  Procedures:     Antimicrobials: Anti-infectives (From admission, onward)   Start     Dose/Rate Route Frequency Ordered Stop   06/04/18 2200  piperacillin-tazobactam (ZOSYN) IVPB 2.25 g  Status:  Discontinued     2.25 g 100 mL/hr over 30 Minutes Intravenous Every 8 hours 06/04/18 1250 06/06/18 1846   06/04/18 1300  piperacillin-tazobactam (ZOSYN) IVPB 3.375 g     3.375 g 100 mL/hr over 30 Minutes Intravenous  Once 06/04/18 1250 06/04/18 1432   06/04/18 1100  cefTRIAXone (ROCEPHIN) 1 g in sodium chloride 0.9 % 100 mL IVPB     1 g 200 mL/hr over 30 Minutes Intravenous  Once 06/04/18 1054 06/04/18 1215      Subjective: No complaints at this time  Objective: Vitals:   06/13/18 0932 06/13/18 2048 06/14/18 0613 06/14/18 1358  BP: 123/76 123/79 131/82 121/75  Pulse: 78 82 78 73  Resp: 17 16 16 15   Temp: 97.9 F (36.6 C) 98.5 F (36.9 C) (!) 97.4 F (36.3 C) 97.7 F (36.5 C)  TempSrc: Oral   Oral  SpO2: 99% 100% 98% 100%  Weight:        Intake/Output Summary (Last 24 hours) at 06/14/2018 1506 Last data filed at 06/14/2018 1256 Gross per 24 hour  Intake 660 ml  Output 50 ml  Net  610 ml   Filed Weights   06/04/18 2056 06/11/18 0642 06/11/18 0949  Weight: 56.7 kg 68.5 kg 66.5 kg    Examination: General exam: Awake, laying in bed, in nad Respiratory system: Normal  respiratory effort, no wheezing  Data Reviewed: I have personally reviewed following labs and imaging studies  CBC: Recent Labs  Lab 06/13/18 0411  WBC 5.9  HGB 8.1*  HCT 25.5*  MCV 78.5*  PLT 761   Basic Metabolic Panel: Recent Labs  Lab 06/10/18 0326 06/11/18 0453 06/12/18 0317 06/13/18 0411 06/14/18 0450  NA 139 138 138 138 138  K 3.9 4.1 3.4* 3.6 3.4*  CL 108 108 104 104 103  CO2 18* 17* 23 22 21*  GLUCOSE 87 90 88 88 91  BUN 43* 50* 26* 32* 40*  CREATININE 8.62* 9.75* 6.94* 8.09* 9.08*  CALCIUM 8.3* 8.6* 7.9* 8.2* 8.4*  PHOS 5.6* 5.8* 4.1 4.7* 5.5*   GFR: CrCl cannot be calculated (Unknown ideal weight.). Liver Function Tests: Recent Labs  Lab 06/08/18 0403  06/10/18 0326 06/11/18 0453 06/12/18 0317 06/13/18 0411 06/14/18 0450  AST 242*  --   --   --   --   --   --   ALT 193*  --   --   --   --   --   --   ALKPHOS 32*  --   --   --   --   --   --   BILITOT 0.7  --   --   --   --   --   --   PROT 4.3*  --   --   --   --   --   --   ALBUMIN 2.2*   < > 2.1* 2.3* 2.2* 2.4* 2.4*   < > = values in this interval not displayed.   No results for input(s): LIPASE, AMYLASE in the last 168 hours. No results for input(s): AMMONIA in the last 168 hours. Coagulation Profile: No results for input(s): INR, PROTIME in the last 168 hours. Cardiac Enzymes: Recent Labs  Lab 06/08/18 0403 06/11/18 0453  CKTOTAL 11,726* 5,411*   BNP (last 3 results) No results for input(s): PROBNP in the last 8760 hours. HbA1C: No results for input(s): HGBA1C in the last 72 hours. CBG: Recent Labs  Lab 06/13/18 2045 06/14/18 0038 06/14/18 0435 06/14/18 0850 06/14/18 1210  GLUCAP 87 91 75 77 103*   Lipid Profile: No results for input(s): CHOL, HDL, LDLCALC, TRIG, CHOLHDL, LDLDIRECT in the last 72 hours. Thyroid Function Tests: No results for input(s): TSH, T4TOTAL, FREET4, T3FREE, THYROIDAB in the last 72 hours. Anemia Panel: No results for input(s): VITAMINB12, FOLATE,  FERRITIN, TIBC, IRON, RETICCTPCT in the last 72 hours. Sepsis Labs: No results for input(s): PROCALCITON, LATICACIDVEN in the last 168 hours.  Recent Results (from the past 240 hour(s))  Urine Culture     Status: None   Collection Time: 06/06/18 10:42 AM  Result Value Ref Range Status   Specimen Description URINE, CATHETERIZED  Final   Special Requests NONE  Final   Culture   Final    NO GROWTH Performed at [facility], [address], [place], [place] [address]    Report Status 06/07/2018 FINAL  Final  MRSA PCR Screening     Status: None   Collection Time: 06/06/18  1:50 PM  Result Value Ref Range Status   MRSA by PCR NEGATIVE NEGATIVE Final    Comment:  The GeneXpert MRSA Assay (FDA approved for NASAL specimens only), is one component of a comprehensive MRSA colonization surveillance program. It is not intended to diagnose MRSA infection nor to guide or monitor treatment for MRSA infections. Performed at [facility], [place] [address] [place], [place] [address]      Radiology Studies: No results found.  Scheduled Meds: . aspirin  81 mg Oral Daily  . [START ON 06/15/2018] Chlorhexidine Gluconate Cloth  6 each Topical Q0600  . darbepoetin (ARANESP) injection - NON-DIALYSIS  100 mcg Subcutaneous Q Sat-1800  . divalproex  125 mg Oral QHS  . ferrous sulfate  325 mg Oral Q breakfast  . fluticasone  1 spray Each Nare Daily  . furosemide  80 mg Intravenous Q12H  . heparin  5,000 Units Subcutaneous Q8H  . insulin aspart  0-9 Units Subcutaneous Q4H  . sodium bicarbonate  650 mg Oral BID  . traZODone  150 mg Oral QHS  . ziprasidone  60 mg Oral Daily   Continuous Infusions: . sodium chloride       LOS: 10 days   Marylu Lund, MD [facility] Pager On Amion  If 7PM-7AM, please contact night-coverage 06/14/2018, 3:06 PM

## 2018-06-14 NOTE — Progress Notes (Signed)
Bladder performed for 138; denies urge to void. Will continue to monitor.   Hiram Comber, RN 06/14/2018 11:20 AM

## 2018-06-14 NOTE — Progress Notes (Signed)
Patient ID: Chad Mathis, male   DOB: 04-12-1955, 63 y.o.   MRN: [id number] S:  no UOP recorded -   no new c/o's   O:BP 131/82 (BP Location: Left Arm)   Pulse 78   Temp (!) 97.4 F (36.3 C)   Resp 16   Wt 66.5 kg   SpO2 98%   BMI 25.97 kg/m   Intake/Output Summary (Last 24 hours) at 06/14/2018 1209 Last data filed at 06/14/2018 0855 Gross per 24 hour  Intake 660 ml  Output -  Net 660 ml   Intake/Output: I/O last 3 completed shifts: In: 540 [P.O.:540] Out: -   Intake/Output this shift:  Total I/O In: 240 [P.O.:240] Out: -  Weight change:  Gen:NAD CVS: no rub  Resp: cta Abd: benign Ext: dep pitting edema  Recent Labs  Lab 06/08/18 0403 06/09/18 0606 06/10/18 0326 06/11/18 0453 06/12/18 0317 06/13/18 0411 06/14/18 0450  NA 136 139 139 138 138 138 138  K 3.5 3.9 3.9 4.1 3.4* 3.6 3.4*  CL 107 108 108 108 104 104 103  CO2 17* 17* 18* 17* 23 22 21*  GLUCOSE 80 95 87 90 88 88 91  BUN 33* 38* 43* 50* 26* 32* 40*  CREATININE 7.15* 7.90* 8.62* 9.75* 6.94* 8.09* 9.08*  ALBUMIN 2.2* 2.3* 2.1* 2.3* 2.2* 2.4* 2.4*  CALCIUM 8.0* 8.5* 8.3* 8.6* 7.9* 8.2* 8.4*  PHOS  --  5.5* 5.6* 5.8* 4.1 4.7* 5.5*  AST 242*  --   --   --   --   --   --   ALT 193*  --   --   --   --   --   --    Liver Function Tests: Recent Labs  Lab 06/08/18 0403  06/12/18 0317 06/13/18 0411 06/14/18 0450  AST 242*  --   --   --   --   ALT 193*  --   --   --   --   ALKPHOS 32*  --   --   --   --   BILITOT 0.7  --   --   --   --   PROT 4.3*  --   --   --   --   ALBUMIN 2.2*   < > 2.2* 2.4* 2.4*   < > = values in this interval not displayed.   No results for input(s): LIPASE, AMYLASE in the last 168 hours. No results for input(s): AMMONIA in the last 168 hours. CBC: Recent Labs  Lab 06/13/18 0411  WBC 5.9  HGB 8.1*  HCT 25.5*  MCV 78.5*  PLT 264   Cardiac Enzymes: Recent Labs  Lab 06/08/18 0403 06/11/18 0453  CKTOTAL 11,726* 5,411*   CBG: Recent Labs  Lab 06/13/18 1715  06/13/18 2045 06/14/18 0038 06/14/18 0435 06/14/18 0850  GLUCAP 80 87 91 75 77    Iron Studies:  No results for input(s): IRON, TIBC, TRANSFERRIN, FERRITIN in the last 72 hours. Studies/Results: No results found. Marland Kitchen aspirin  81 mg Oral Daily  . Chlorhexidine Gluconate Cloth  6 each Topical Q0600  . darbepoetin (ARANESP) injection - NON-DIALYSIS  100 mcg Subcutaneous Q Sat-1800  . divalproex  125 mg Oral QHS  . ferrous sulfate  325 mg Oral Q breakfast  . fluticasone  1 spray Each Nare Daily  . furosemide  80 mg Intravenous Q12H  . heparin  5,000 Units Subcutaneous Q8H  . insulin aspart  0-9 Units Subcutaneous Q4H  . sodium  bicarbonate  650 mg Oral BID  . traZODone  150 mg Oral QHS  . ziprasidone  60 mg Oral Daily    BMET    Component Value Date/Time   NA 138 06/14/2018 0450   K 3.4 (L) 06/14/2018 0450   CL 103 06/14/2018 0450   CO2 21 (L) 06/14/2018 0450   GLUCOSE 91 06/14/2018 0450   BUN 40 (H) 06/14/2018 0450   CREATININE 9.08 (H) 06/14/2018 0450   CALCIUM 8.4 (L) 06/14/2018 0450   GFRNONAA 6 (L) 06/14/2018 0450   GFRAA 6 (L) 06/14/2018 0450   CBC    Component Value Date/Time   WBC 5.9 06/13/2018 0411   RBC 3.25 (L) 06/13/2018 0411   HGB 8.1 (L) 06/13/2018 0411   HCT 25.5 (L) 06/13/2018 0411   HCT 28.3 (L) 06/05/2018 1207   PLT 264 06/13/2018 0411   MCV 78.5 (L) 06/13/2018 0411   MCH 24.9 (L) 06/13/2018 0411   MCHC 31.8 06/13/2018 0411   RDW 13.1 06/13/2018 0411   LYMPHSABS 0.6 (L) 06/04/2018 0957   MONOABS 0.8 06/04/2018 0957   EOSABS 0.1 06/04/2018 0957   BASOSABS 0.0 06/04/2018 0957    Assessment/Plan:  1. AKI/CKD stage 3 (last known Cr was 1.42 in Dec 2017). Oliguric presumably multifactorial; volume depletion due to N/V in setting of concomitant ACE inhibition and diuretics with superimposed rhabdomyolysis. s/p  HD on 06/04/18 due to hyperkalemia. [place] without hydro but did show increased echogenicity consistent with chronic medical renal  disease.  1. Continued to hold benazepril, hctz, and glucophage 2. Avoid nephrotoxic agents 3.  stopped IVF -  CK finally trending down  4. remains oliguric  5. Did HD 6/4 as felt badly and numbers do continue to worsen off HD  vascath placed 73/71- 11 days old .  Continue to follow closely.   removed foley cath as feel he could use a urinal.  No dialysis indications today but no urine - will plan HD treatment tomorrow 6/8- then will need to remove vascath- if cont to not show signs of recovery will need TDC/AVF and CLIP 2. Metabolic acidosis- better 3. Rhabdomyolysis- likely contributing to AKI but unclear etiology other than statin therapy. No history of falls or being found down.  follow levels, trending down- will check another tomorrow 4. Anemia- presumably due to CKD, however unknown baseline Cr.  SPEP/UPEP neg, iron stores OK and follow. Started ESA 5. SIRS- completed zosyn - covid-19 negative  6. DM- per primary svc 7. HTN- low BP's on arrival, meds on hold   [place]

## 2018-06-14 NOTE — Progress Notes (Signed)
50 mL of amber, cloudy urine caught in urinal; some unmeasurable urine mixed with loose, brown stool.    Hiram Comber, RN 06/14/2018 12:57 PM

## 2018-06-14 NOTE — Patient Care Conference (Signed)
Tried calling patient's documented legal guardian for update. No answer

## 2018-06-15 LAB — CBC
HCT: 25.1 % — ABNORMAL LOW (ref 39.0–52.0)
Hemoglobin: 7.9 g/dL — ABNORMAL LOW (ref 13.0–17.0)
MCH: 25.2 pg — ABNORMAL LOW (ref 26.0–34.0)
MCHC: 31.5 g/dL (ref 30.0–36.0)
MCV: 79.9 fL — ABNORMAL LOW (ref 80.0–100.0)
Platelets: 290 10*3/uL (ref 150–400)
RBC: 3.14 MIL/uL — ABNORMAL LOW (ref 4.22–5.81)
RDW: 13.2 % (ref 11.5–15.5)
WBC: 7.8 10*3/uL (ref 4.0–10.5)
nRBC: 0 % (ref 0.0–0.2)

## 2018-06-15 LAB — RENAL FUNCTION PANEL
Albumin: 2.2 g/dL — ABNORMAL LOW (ref 3.5–5.0)
Albumin: 2.1 g/dL — ABNORMAL LOW (ref 3.5–5.0)
Anion gap: 11 (ref 5–15)
Anion gap: 12 (ref 5–15)
BUN: 44 mg/dL — ABNORMAL HIGH (ref 8–23)
BUN: 44 mg/dL — ABNORMAL HIGH (ref 8–23)
CO2: 22 mmol/L (ref 22–32)
CO2: 22 mmol/L (ref 22–32)
Calcium: 8.1 mg/dL — ABNORMAL LOW (ref 8.9–10.3)
Calcium: 8.1 mg/dL — ABNORMAL LOW (ref 8.9–10.3)
Chloride: 105 mmol/L (ref 98–111)
Chloride: 104 mmol/L (ref 98–111)
Creatinine, Ser: 10.32 mg/dL — ABNORMAL HIGH (ref 0.61–1.24)
Creatinine, Ser: 10.14 mg/dL — ABNORMAL HIGH (ref 0.61–1.24)
GFR calc Af Amer: 5 mL/min — ABNORMAL LOW (ref >60)
GFR calc Af Amer: 6 mL/min — ABNORMAL LOW (ref >60)
GFR calc non Af Amer: 5 mL/min — ABNORMAL LOW (ref >60)
GFR calc non Af Amer: 5 mL/min — ABNORMAL LOW (ref >60)
Glucose, Bld: 95 mg/dL (ref 70–99)
Glucose, Bld: 84 mg/dL (ref 70–99)
Phosphorus: 6 mg/dL — ABNORMAL HIGH (ref 2.5–4.6)
Phosphorus: 6.2 mg/dL — ABNORMAL HIGH (ref 2.5–4.6)
Potassium: 3.3 mmol/L — ABNORMAL LOW (ref 3.5–5.1)
Potassium: 3.3 mmol/L — ABNORMAL LOW (ref 3.5–5.1)
Sodium: 138 mmol/L (ref 135–145)
Sodium: 138 mmol/L (ref 135–145)

## 2018-06-15 LAB — GLUCOSE, CAPILLARY
Glucose-Capillary: 102 mg/dL — ABNORMAL HIGH (ref 70–99)
Glucose-Capillary: 71 mg/dL (ref 70–99)
Glucose-Capillary: 84 mg/dL (ref 70–99)
Glucose-Capillary: 85 mg/dL (ref 70–99)
Glucose-Capillary: 88 mg/dL (ref 70–99)

## 2018-06-15 LAB — PROTIME-INR
INR: 1.1 (ref 0.8–1.2)
Prothrombin Time: 14 seconds (ref 11.4–15.2)

## 2018-06-15 MED ORDER — HEPARIN SODIUM (PORCINE) 1000 UNIT/ML DIALYSIS
20.0000 [IU]/kg | INTRAMUSCULAR | Status: DC | PRN
  Administered 2018-06-15: 09:00:00 1300 [IU] via INTRAVENOUS_CENTRAL
  Administered 2018-06-15: 2400 [IU] via INTRAVENOUS_CENTRAL
  Filled 2018-06-15 (×2): qty 1.3

## 2018-06-15 MED ORDER — HEPARIN SODIUM (PORCINE) 1000 UNIT/ML IJ SOLN
INTRAMUSCULAR | Status: AC
  Administered 2018-06-15: 1300 [IU] via INTRAVENOUS_CENTRAL
  Filled 2018-06-15: qty 3

## 2018-06-15 NOTE — Progress Notes (Signed)
PT Cancellation Note  Patient Details Name: Chad Mathis MRN: [id number] DOB: March 07, 1955   Cancelled Treatment:     Patient found in room with blanket pulled over his head. He declined PT today. He had had dialysis earlier in the day. Therapy will follow up on 06/15/2018 as able.    Carney Living 06/15/2018, 4:15 PM

## 2018-06-15 NOTE — Progress Notes (Signed)
PROGRESS NOTE    Chad Mathis  IDP:[id number] DOB: 11/12/1955 DOA: 06/04/2018 PCP: Verner Chol, MD    Brief Narrative:  63 y.o.malewith medical history significant ofdiabetes, bipolar disorder.Patient reports nausea and vomiting for the last 3 days. The symptoms have worsened. He has not been able to tolerate a diet. He has not taken anything to help with the symptoms. No associated chest pain, shortness of breath, abdominal pain, diarrhea. He reports decreased urine output since Monday.  Assessment & Plan:   Principal Problem:   Acute renal failure (HCC) Active Problems:   Diabetes mellitus (HCC)   SIRS (systemic inflammatory response syndrome) (HCC)  Acute on chronic renal failure: Stage III at baseline secondary to diabetes most likely -Remains oliguric on exam -Creatinine trended up to over 10 -continue bicarb BID -Electrolytes remain stable at this time -Nephrology cont to follow  SIRS -Currently afebrile and with normal WBC's -COVID-19 neg -Continue to follow final culture results (no growth or microorganism isolated) -Continue monitoring off antibiotics -remains clinically stable at this time.  Nausea/vomiting -Secondary to uremia versus underlying viral gastroenteritis -Continue as needed antiemetics -Continue IV fluids -Cont diet as tolerated  Hyperkalemia/hypokalemia -In the setting of acute renal failure -Improved and resolved after hemodialysis -Patient also receive bicarbonate, Lokelma and calcium gluconate at time of admission. -Continue to follow electrolytes and further replete as needed.     Type II diabetes mellitus with nephropathy -Continue sliding scale insulin -Follow CBGs and adjust hypoglycemic regimen as needed -Continue holding oral hypoglycemic agents. -Glucose trends remain stable  Bipolar disorder/schizophrenia -Continue Depakote and Geodon -No recent suicidal ideation or hallucinations noted  Elevated  transaminitis -No prior history of liver disease -Continue holding statins -Follow LFTs trend -Most likely associated with shock liver and rhabdomyolysis. -CK trended down  Essential HTN -continue holding antihypertensive agents) -Stable at this time  Urinary retention -Continue the use of Foley catheter. -Follow I's and O's  Insomnia -will continue the use of Restoril as needed.   DVT prophylaxis: Heparin subQ Code Status: Full Family Communication: Pt in room, family not at bedside Disposition Plan: Uncertain at this time  Consultants:   Nephrology  Procedures:     Antimicrobials: Anti-infectives (From admission, onward)   Start     Dose/Rate Route Frequency Ordered Stop   06/04/18 2200  piperacillin-tazobactam (ZOSYN) IVPB 2.25 g  Status:  Discontinued     2.25 g 100 mL/hr over 30 Minutes Intravenous Every 8 hours 06/04/18 1250 06/06/18 1846   06/04/18 1300  piperacillin-tazobactam (ZOSYN) IVPB 3.375 g     3.375 g 100 mL/hr over 30 Minutes Intravenous  Once 06/04/18 1250 06/04/18 1432   06/04/18 1100  cefTRIAXone (ROCEPHIN) 1 g in sodium chloride 0.9 % 100 mL IVPB     1 g 200 mL/hr over 30 Minutes Intravenous  Once 06/04/18 1054 06/04/18 1215      Subjective: Without complaints  Objective: Vitals:   06/15/18 0900 06/15/18 0930 06/15/18 1000 06/15/18 1030  BP: (!) 85/53 (!) 78/49 (!) 84/47 (!) 85/52  Pulse: 88 91 93 91  Resp: 16 14 14 15   Temp:      TempSrc:      SpO2:      Weight:        Intake/Output Summary (Last 24 hours) at 06/15/2018 1107 Last data filed at 06/14/2018 2200 Gross per 24 hour  Intake 540 ml  Output 50 ml  Net 490 ml   Filed Weights   06/11/18 4431 06/11/18 0949 06/15/18 5400  Weight: 68.5 kg 66.5 kg 66.9 kg    Examination: General exam: Conversant, in no acute distress Respiratory system: normal chest rise, clear, no audible wheezing  Data Reviewed: I have personally reviewed following labs and imaging studies   CBC: Recent Labs  Lab 06/13/18 0411 06/15/18 0746  WBC 5.9 7.8  HGB 8.1* 7.9*  HCT 25.5* 25.1*  MCV 78.5* 79.9*  PLT 264 254   Basic Metabolic Panel: Recent Labs  Lab 06/12/18 0317 06/13/18 0411 06/14/18 0450 06/15/18 0408 06/15/18 0746  NA 138 138 138 138 138  K 3.4* 3.6 3.4* 3.3* 3.3*  CL 104 104 103 105 104  CO2 23 22 21* 22 22  GLUCOSE 88 88 91 95 84  BUN 26* 32* 40* 44* 44*  CREATININE 6.94* 8.09* 9.08* 10.32* 10.14*  CALCIUM 7.9* 8.2* 8.4* 8.1* 8.1*  PHOS 4.1 4.7* 5.5* 6.0* 6.2*   GFR: CrCl cannot be calculated (Unknown ideal weight.). Liver Function Tests: Recent Labs  Lab 06/12/18 0317 06/13/18 0411 06/14/18 0450 06/15/18 0408 06/15/18 0746  ALBUMIN 2.2* 2.4* 2.4* 2.2* 2.1*   No results for input(s): LIPASE, AMYLASE in the last 168 hours. No results for input(s): AMMONIA in the last 168 hours. Coagulation Profile: No results for input(s): INR, PROTIME in the last 168 hours. Cardiac Enzymes: Recent Labs  Lab 06/11/18 0453  CKTOTAL 5,411*   BNP (last 3 results) No results for input(s): PROBNP in the last 8760 hours. HbA1C: No results for input(s): HGBA1C in the last 72 hours. CBG: Recent Labs  Lab 06/14/18 1210 06/14/18 1610 06/14/18 2021 06/15/18 0029 06/15/18 0454  GLUCAP 103* 82 127* 102* 84   Lipid Profile: No results for input(s): CHOL, HDL, LDLCALC, TRIG, CHOLHDL, LDLDIRECT in the last 72 hours. Thyroid Function Tests: No results for input(s): TSH, T4TOTAL, FREET4, T3FREE, THYROIDAB in the last 72 hours. Anemia Panel: No results for input(s): VITAMINB12, FOLATE, FERRITIN, TIBC, IRON, RETICCTPCT in the last 72 hours. Sepsis Labs: No results for input(s): PROCALCITON, LATICACIDVEN in the last 168 hours.  Recent Results (from the past 240 hour(s))  Urine Culture     Status: None   Collection Time: 06/06/18 10:42 AM  Result Value Ref Range Status   Specimen Description URINE, CATHETERIZED  Final   Special Requests NONE  Final    Culture   Final    NO GROWTH Performed at [facility], [address] [place], [place] [address]    Report Status 06/07/2018 FINAL  Final  MRSA PCR Screening     Status: None   Collection Time: 06/06/18  1:50 PM  Result Value Ref Range Status   MRSA by PCR NEGATIVE NEGATIVE Final    Comment:        The GeneXpert MRSA Assay (FDA approved for NASAL specimens only), is one component of a comprehensive MRSA colonization surveillance program. It is not intended to diagnose MRSA infection nor to guide or monitor treatment for MRSA infections. Performed at [facility], [place] [address] [place], [place] [address]      Radiology Studies: No results found.  Scheduled Meds: . heparin      . aspirin  81 mg Oral Daily  . Chlorhexidine Gluconate Cloth  6 each Topical Q0600  . darbepoetin (ARANESP) injection - NON-DIALYSIS  100 mcg Subcutaneous Q Sat-1800  . divalproex  125 mg Oral QHS  . ferrous sulfate  325 mg Oral Q breakfast  . fluticasone  1 spray Each Nare Daily  . furosemide  80 mg Intravenous Q12H  . heparin  5,000 Units Subcutaneous Q8H  . insulin aspart  0-9 Units Subcutaneous Q4H  . sodium bicarbonate  650 mg Oral BID  . traZODone  150 mg Oral QHS  . ziprasidone  60 mg Oral Daily   Continuous Infusions: . sodium chloride       LOS: 11 days   Marylu Lund, MD [facility] Pager On Amion  If 7PM-7AM, please contact night-coverage 06/15/2018, 11:07 AM

## 2018-06-15 NOTE — Progress Notes (Deleted)
IR requested for possible image-guided tunneled HD catheter conversion.  Patient underwent a sedation procedure in IR today. Plan for procedure tomorrow 06/16/2018 in IR. Will consent in IR tomorrow prior to procedure as patient was sedated today. Patient will be NPO at midnight. Hold Heparin at midnight. INR pending.  Please call IR with questions/concerns.   Bea Graff Mikenna Bunkley, PA-C 06/15/2018, 1:26 PM

## 2018-06-15 NOTE — Consult Note (Signed)
Chief Complaint: Patient was seen in consultation today for renal failure  Referring Physician(s): Dr. Augustin Coupe  Supervising Physician: Daryll Brod  Patient Status: [facility] - Out-pt  History of Present Illness: Chad Mathis is a 63 y.o. male with past medical history of bipolar disease, DM, and legal blindness who presents to [facility] ED with vomiting.  Patient found to have acute renal failure.  He has been undergoing dialysis via temporary HD cath.  Request now made for tunneled dialysis catheter placement at the request of Dr. Augustin Coupe.   Patient completed dialysis this AM.  Eating lunch at time of visit.   Past Medical History:  Diagnosis Date  . Asthma   . Bipolar 1 disorder ([place])   . Diabetes mellitus without complication [facility])     Past Surgical History:  Procedure Laterality Date  . HERNIA REPAIR      Allergies: Patient has no known allergies.  Medications: Prior to Admission medications   Medication Sig Start Date End Date Taking? Authorizing Provider  aspirin 81 MG chewable tablet Chew 81 mg by mouth daily.   Yes [provider]  divalproex (DEPAKOTE) 125 MG DR tablet Take 125 mg by mouth at bedtime. 05/06/18  Yes [provider]  ferrous sulfate 325 (65 FE) MG tablet Take 325 mg by mouth daily with breakfast.   Yes [provider]  fluticasone (FLONASE) 50 MCG/ACT nasal spray Place 1 spray into both nostrils daily.   Yes [provider]  gemfibrozil (LOPID) 600 MG tablet Take 600 mg by mouth 2 (two) times daily before a meal.  02/16/13  Yes [provider]  hydrochlorothiazide (HYDRODIURIL) 12.5 MG tablet Take 12.5 mg by mouth daily. 05/06/18  Yes [provider]  metFORMIN (GLUCOPHAGE) 1000 MG tablet Take 1,000 mg by mouth 2 (two) times daily. 05/06/18  Yes [provider]  simvastatin (ZOCOR) 40 MG tablet Take 40 mg by mouth daily at 6 PM.  02/16/13  Yes [provider]  traZODone (DESYREL) 150 MG tablet  Take 150 mg by mouth at bedtime. 05/06/18  Yes [provider]  ziprasidone (GEODON) 60 MG capsule Take 60 mg by mouth daily.  02/16/13  Yes [provider]  traZODone (DESYREL) 100 MG tablet  02/16/13   [provider]     Family History  Problem Relation Age of Onset  . Renal Disease Neg Hx     Social History   Socioeconomic History  . Marital status: Single    Spouse name: Not on file  . Number of children: Not on file  . Years of education: Not on file  . Highest education level: Not on file  Occupational History  . Not on file  Social Needs  . Financial resource strain: Not on file  . Food insecurity:    Worry: Not on file    Inability: Not on file  . Transportation needs:    Medical: Not on file    Non-medical: Not on file  Tobacco Use  . Smoking status: Never Smoker  . Smokeless tobacco: Never Used  Substance and Sexual Activity  . Alcohol use: No  . Drug use: Not on file  . Sexual activity: Not on file  Lifestyle  . Physical activity:    Days per week: Not on file    Minutes per session: Not on file  . Stress: Not on file  Relationships  . Social connections:    Talks on phone: Not on file  Gets together: Not on file    Attends religious service: Not on file    Active member of club or organization: Not on file    Attends meetings of clubs or organizations: Not on file    Relationship status: Not on file  Other Topics Concern  . Not on file  Social History Narrative  . Not on file     Review of Systems: A 12 point ROS discussed and pertinent positives are indicated in the HPI above.  All other systems are negative.  Review of Systems  Constitutional: Negative for fatigue and fever.  Respiratory: Negative for cough and shortness of breath.   Cardiovascular: Negative for chest pain.  Gastrointestinal: Negative for abdominal pain.  Genitourinary: Negative for dysuria.  Musculoskeletal: Negative for back pain.   Psychiatric/Behavioral: Negative for behavioral problems and confusion.    Vital Signs: BP 101/64   Pulse 82   Temp (!) 97.4 F (36.3 C) (Oral)   Resp 16   Wt 137 lb 12.6 oz (62.5 kg)   SpO2 99%   BMI 24.41 kg/m   Physical Exam Vitals signs and nursing note reviewed.  Constitutional:      Appearance: He is normal weight.  Cardiovascular:     Rate and Rhythm: Normal rate and regular rhythm.  Pulmonary:     Effort: Pulmonary effort is normal. No respiratory distress.     Breath sounds: Normal breath sounds.  Abdominal:     General: Abdomen is flat.     Palpations: Abdomen is soft.  Skin:    General: Skin is warm and dry.  Neurological:     General: No focal deficit present.     Mental Status: He is alert and oriented to person, place, and time. Mental status is at baseline.  Psychiatric:        Mood and Affect: Mood normal.        Behavior: Behavior normal.        Thought Content: Thought content normal.        Judgment: Judgment normal.      MD Evaluation Airway: WNL Heart: WNL Abdomen: WNL Chest/ Lungs: WNL ASA  Classification: 3 Mallampati/Airway Score: One   Imaging: US Renal  Result Date: 06/04/2018 CLINICAL DATA:  Weakness, hyperglycemic EXAM: RENAL / URINARY TRACT ULTRASOUND COMPLETE COMPARISON:  None. FINDINGS: Right Kidney: Renal measurements: 11.7 x 5.8 x 6 cm = volume: 211 mL. Parenchyma is hyperechoic to the adjacent liver. No mass or hydronephrosis visualized. Left Kidney: Renal measurements: 10.9 x 5.9 x 5.6 cm = volume: 186 mL. Echogenic parenchyma. No hydronephrosis. 1 x 0.8 cm cyst in the upper pole. Bladder: Appears normal for degree of bladder distention. IMPRESSION: 1. Echogenic renal parenchyma, a nonspecific indicator of medical renal disease. 2. No hydronephrosis. Electronically Signed   By: Lucrezia Europe M.D.   On: 06/04/2018 16:52   Dg Chest Port 1 View  Result Date: 06/04/2018 CLINICAL DATA:  Worsening nausea vomiting for 3 days EXAM:  PORTABLE CHEST 1 VIEW COMPARISON:  None. FINDINGS: There is a right jugular central venous catheter with the tip projecting over the SVC. There is no focal parenchymal opacity. There is no pleural effusion or pneumothorax. The heart and mediastinal contours are unremarkable. There is gaseous distension of the stomach. The osseous structures are unremarkable. IMPRESSION: No active disease. Electronically Signed   By: Kathreen Devoid   On: 06/04/2018 12:47    Labs:  CBC: Recent Labs    06/05/18 0715 06/05/18 1207 06/06/18  4497 06/13/18 0411 06/15/18 0746  WBC 7.0  --  6.7 5.9 7.8  HGB 10.0*  --  9.8* 8.1* 7.9*  HCT 30.1* 28.3* 28.5* 25.5* 25.1*  PLT 205  --  160 264 290    COAGS: Recent Labs    06/04/18 1044 06/15/18 1420  INR 1.1 1.1  APTT 29  --     BMP: Recent Labs    06/13/18 0411 06/14/18 0450 06/15/18 0408 06/15/18 0746  NA 138 138 138 138  K 3.6 3.4* 3.3* 3.3*  CL 104 103 105 104  CO2 22 21* 22 22  GLUCOSE 88 91 95 84  BUN 32* 40* 44* 44*  CALCIUM 8.2* 8.4* 8.1* 8.1*  CREATININE 8.09* 9.08* 10.32* 10.14*  GFRNONAA 6* 6* 5* 5*  GFRAA 7* 6* 5* 6*    LIVER FUNCTION TESTS: Recent Labs    06/05/18 0715 06/06/18 0316 06/07/18 0349 06/08/18 0403  06/13/18 0411 06/14/18 0450 06/15/18 0408 06/15/18 0746  BILITOT 0.6 0.7 0.6 0.7  --   --   --   --   --   AST 199* 213* 215* 242*  --   --   --   --   --   ALT 157* 162* 161* 193*  --   --   --   --   --   ALKPHOS 35* 31* 30* 32*  --   --   --   --   --   PROT 5.4* 4.9* 4.5* 4.3*  --   --   --   --   --   ALBUMIN 2.7* 2.5* 2.2* 2.2*   < > 2.4* 2.4* 2.2* 2.1*   < > = values in this interval not displayed.    TUMOR MARKERS: No results for input(s): AFPTM, CEA, CA199, CHROMGRNA in the last 8760 hours.  Assessment and Plan: Renal failure IR consulted for tunneled HD catheter placement.  S/p HD session this AM.   Now in room eating lunch.  R temp cath in place.  INR 1.1  Risks and benefits discussed with  the patient including, but not limited to bleeding, infection, vascular injury, pneumothorax which may require chest tube placement, air embolism or even death  All of the patient's questions were answered, patient is agreeable to proceed. Consent signed and in chart.  Plan to proceed as schedule allow tomorrow.  Orders in place.   Thank you for this interesting consult.  I greatly enjoyed meeting JEMMIE RHINEHART and look forward to participating in their care.  A copy of this report was sent to the requesting provider on this date.  Electronically Signed: Docia Barrier, PA 06/15/2018, 3:03 PM   I spent a total of 20 Minutes    in face to face in clinical consultation, greater than 50% of which was counseling/coordinating care for renal failure.

## 2018-06-15 NOTE — Progress Notes (Signed)
Patient back to unit from dialysis. Vital signs stable. Alert and oriented x 4. Bladder scan performed for 123; patient denies the urge to void. Urinal within reach in case he does. Denies any complaints. Will continue to monitor.  Hiram Comber, RN 06/15/2018 12:49 PM

## 2018-06-15 NOTE — Progress Notes (Signed)
Renal Navigator submitted referral for OP HD to [facility] per discussions with Nephrology/Dr. Augustin Coupe and CSW/N. Rayyan (patient being discharged to [place]). Renal Navigator will notify medical team once seat schedule has been obtained.  Alphonzo Cruise,  Renal Navigator [phone]

## 2018-06-15 NOTE — Progress Notes (Signed)
  [facility] Progress Note    Assessment/ Plan:   1. AKI/CKD stage 3 (last known Cr was 1.42 in Dec 2017). Oliguric presumably multifactorial; volume depletion due to N/V in setting of concomitant ACE inhibition and diureticswith superimposed rhabdomyolysis. s/p  HD on 06/04/18 due to hyperkalemia. [place] without hydro but did show increased echogenicity consistent with chronic medical renal disease.  1. Continued to hold benazepril, hctz, and glucophage 2. Avoid nephrotoxic agents 3.  stopped IVF -  CK finally trending down  4. remains oliguric  5. Did HD 6/4 as felt badly and numbers do continue to worsen off HD  vascath placed 81/52- 18 days old -> will convert to [facility]  + permanent shunt and CLIP  Seen on HD 92/49 2.5L (2L net) 4K bath RIJ temp cath  Will also check a bladder scan later today.  3. Metabolic acidosis- better 4. Rhabdomyolysis- likely contributing to AKI but unclear etiology other than statin therapy. No history of falls or being found down.  follow levels, trending down 5. Anemia- presumably due to CKD, however unknown baseline Cr.  SPEP/UPEP neg, iron stores OK and follow. Started ESA 6. SIRS- completed zosyn - covid-19 negative  6. DM- per primary svc 7. HTN- low BP's on arrival, meds on hold  Subjective:   Denies urgency, fever, chills, dyspnea (unless w/ exertion)   Objective:   BP 99/63   Pulse 93   Temp 97.8 F (36.6 C) (Oral)   Resp 17   Wt 66.9 kg   SpO2 99%   BMI 26.13 kg/m   Intake/Output Summary (Last 24 hours) at 06/15/2018 1011 Last data filed at 06/14/2018 2200 Gross per 24 hour  Intake 540 ml  Output 50 ml  Net 490 ml   Weight change:   Physical Exam: Gen:NAD CVS: no rub  Resp: cta Abd: benign Ext: dep pitting edema  Imaging: No results found.  Labs: BMET Recent Labs  Lab 06/10/18 0326 06/11/18 0453 06/12/18 0317 06/13/18 0411 06/14/18 0450 06/15/18 0408 06/15/18 0746  NA 139 138 138 138 138 138  138  K 3.9 4.1 3.4* 3.6 3.4* 3.3* 3.3*  CL 108 108 104 104 103 105 104  CO2 18* 17* 23 22 21* 22 22  GLUCOSE 87 90 88 88 91 95 84  BUN 43* 50* 26* 32* 40* 44* 44*  CREATININE 8.62* 9.75* 6.94* 8.09* 9.08* 10.32* 10.14*  CALCIUM 8.3* 8.6* 7.9* 8.2* 8.4* 8.1* 8.1*  PHOS 5.6* 5.8* 4.1 4.7* 5.5* 6.0* 6.2*   CBC Recent Labs  Lab 06/13/18 0411 06/15/18 0746  WBC 5.9 7.8  HGB 8.1* 7.9*  HCT 25.5* 25.1*  MCV 78.5* 79.9*  PLT 264 290    Medications:    . heparin      . aspirin  81 mg Oral Daily  . Chlorhexidine Gluconate Cloth  6 each Topical Q0600  . darbepoetin (ARANESP) injection - NON-DIALYSIS  100 mcg Subcutaneous Q Sat-1800  . divalproex  125 mg Oral QHS  . ferrous sulfate  325 mg Oral Q breakfast  . fluticasone  1 spray Each Nare Daily  . furosemide  80 mg Intravenous Q12H  . heparin  5,000 Units Subcutaneous Q8H  . insulin aspart  0-9 Units Subcutaneous Q4H  . sodium bicarbonate  650 mg Oral BID  . traZODone  150 mg Oral QHS  . ziprasidone  60 mg Oral Daily      Otelia Santee, MD 06/15/2018, 10:11 AM

## 2018-06-16 ENCOUNTER — Other Ambulatory Visit: Payer: Self-pay

## 2018-06-16 ENCOUNTER — Inpatient Hospital Stay (HOSPITAL_COMMUNITY): Payer: Medicare HMO

## 2018-06-16 ENCOUNTER — Encounter (HOSPITAL_COMMUNITY): Payer: Self-pay | Admitting: Interventional Radiology

## 2018-06-16 DIAGNOSIS — N185 Chronic kidney disease, stage 5: Secondary | ICD-10-CM

## 2018-06-16 HISTORY — PX: IR US GUIDE VASC ACCESS RIGHT: IMG2390

## 2018-06-16 HISTORY — PX: IR FLUORO GUIDE CV LINE RIGHT: IMG2283

## 2018-06-16 LAB — GLUCOSE, CAPILLARY
Glucose-Capillary: 117 mg/dL — ABNORMAL HIGH (ref 70–99)
Glucose-Capillary: 76 mg/dL (ref 70–99)
Glucose-Capillary: 76 mg/dL (ref 70–99)
Glucose-Capillary: 79 mg/dL (ref 70–99)
Glucose-Capillary: 80 mg/dL (ref 70–99)
Glucose-Capillary: 94 mg/dL (ref 70–99)

## 2018-06-16 LAB — RENAL FUNCTION PANEL
Albumin: 2.3 g/dL — ABNORMAL LOW (ref 3.5–5.0)
Anion gap: 12 (ref 5–15)
BUN: 20 mg/dL (ref 8–23)
CO2: 22 mmol/L (ref 22–32)
Calcium: 7.8 mg/dL — ABNORMAL LOW (ref 8.9–10.3)
Chloride: 101 mmol/L (ref 98–111)
Creatinine, Ser: 6 mg/dL — ABNORMAL HIGH (ref 0.61–1.24)
GFR calc Af Amer: 11 mL/min — ABNORMAL LOW (ref >60)
GFR calc non Af Amer: 9 mL/min — ABNORMAL LOW (ref >60)
Glucose, Bld: 83 mg/dL (ref 70–99)
Phosphorus: 3.6 mg/dL (ref 2.5–4.6)
Potassium: 4.8 mmol/L (ref 3.5–5.1)
Sodium: 135 mmol/L (ref 135–145)

## 2018-06-16 MED ORDER — CEFAZOLIN SODIUM-DEXTROSE 2-4 GM/100ML-% IV SOLN
INTRAVENOUS | Status: AC
  Filled 2018-06-16: qty 100

## 2018-06-16 MED ORDER — CEFAZOLIN (ANCEF) 1 G IV SOLR
INTRAVENOUS | Status: AC | PRN
  Administered 2018-06-16: 1 g

## 2018-06-16 MED ORDER — FENTANYL CITRATE (PF) 100 MCG/2ML IJ SOLN
INTRAMUSCULAR | Status: AC
  Filled 2018-06-16: qty 2

## 2018-06-16 MED ORDER — GELATIN ABSORBABLE 12-7 MM EX MISC
CUTANEOUS | Status: AC
  Filled 2018-06-16: qty 1

## 2018-06-16 MED ORDER — FENTANYL CITRATE (PF) 100 MCG/2ML IJ SOLN
INTRAMUSCULAR | Status: AC | PRN
  Administered 2018-06-16: 50 ug via INTRAVENOUS

## 2018-06-16 MED ORDER — CEFAZOLIN SODIUM-DEXTROSE 1-4 GM/50ML-% IV SOLN
1.0000 g | INTRAVENOUS | Status: AC
  Administered 2018-06-17: 1 g via INTRAVENOUS
  Filled 2018-06-16 (×2): qty 50

## 2018-06-16 MED ORDER — LIDOCAINE HCL 1 % IJ SOLN
INTRAMUSCULAR | Status: AC
  Filled 2018-06-16: qty 20

## 2018-06-16 MED ORDER — HEPARIN SODIUM (PORCINE) 1000 UNIT/ML IJ SOLN
INTRAMUSCULAR | Status: AC
  Filled 2018-06-16: qty 1

## 2018-06-16 MED ORDER — MIDAZOLAM HCL 2 MG/2ML IJ SOLN
INTRAMUSCULAR | Status: AC
  Filled 2018-06-16: qty 2

## 2018-06-16 MED ORDER — LIDOCAINE HCL 1 % IJ SOLN
INTRAMUSCULAR | Status: AC | PRN
  Administered 2018-06-16: 10 mL

## 2018-06-16 MED ORDER — MIDAZOLAM HCL 2 MG/2ML IJ SOLN
INTRAMUSCULAR | Status: AC | PRN
  Administered 2018-06-16: 1 mg via INTRAVENOUS

## 2018-06-16 NOTE — Procedures (Signed)
ESRD  S/p RT IJ TUNNELED HD CATH  No comp Stable ebl min Tip svcra Ready for use

## 2018-06-16 NOTE — Progress Notes (Signed)
Patient has been accepted at [facility] on a MWF schedule with a seat time of 12:05pm. He needs to arrive at the clinic at 11:30am on his first day of treatment in order to sign paperwork.  Renal Navigator notified Nephrologist/Dr. Augustin Coupe and CSW/N. Rayyan. Renal Navigator will follow closely for discharge plan and notify OP HD clinic regarding patient's start date.  Alphonzo Cruise, [place] Renal Navigator [phone]

## 2018-06-16 NOTE — H&P (View-Only) (Signed)
[facility] NOTE   MRN : [id number]  Reason for Consult: ESRD Referring Physician: Dr. Augustin Coupe  History of Present Illness: 63 y/o male presented to [facility] ED 06/04/2018 with nausea, vomiting and progressive weakness.  He was worked up for AKI.  He now requires HD.  IR is placing a TDC today and we have been consulted for permanent access.  No history if ICD.    Past medical history : DM, Blindness and bipolar disorder.       Current Facility-Administered Medications  Medication Dose Route Frequency Provider Last Rate Last Dose  . ceFAZolin (ANCEF) 2-4 GM/100ML-% IVPB           . fentaNYL (SUBLIMAZE) 100 MCG/2ML injection           . gelatin adsorbable (GELFOAM/SURGIFOAM) 12-7 MM sponge 12-7 mm           . heparin 1000 UNIT/ML injection           . lidocaine (XYLOCAINE) 1 % (with pres) injection           . midazolam (VERSED) 2 MG/2ML injection           . acetaminophen (TYLENOL) tablet 650 mg  650 mg Oral Q6H PRN Mariel Aloe, MD       Or  . acetaminophen (TYLENOL) suppository 650 mg  650 mg Rectal Q6H PRN Mariel Aloe, MD      . aspirin chewable tablet 81 mg  81 mg Oral Daily Mariel Aloe, MD   Stopped at 06/16/18 1000  . ceFAZolin (ANCEF) powder    PRN Greggory Keen, MD   1 g at 06/16/18 1112  . Chlorhexidine Gluconate Cloth 2 % PADS 6 each  6 each Topical Q0600 Corliss Parish, MD   6 each at 06/15/18 [phone]  . Darbepoetin Alfa (ARANESP) injection 100 mcg  100 mcg Subcutaneous Q Sat-1800 Corliss Parish, MD   100 mcg at 06/13/18 1648  . divalproex (DEPAKOTE) DR tablet 125 mg  125 mg Oral QHS Mariel Aloe, MD   125 mg at 06/15/18 2230  . fentaNYL (SUBLIMAZE) injection   Intravenous PRN Greggory Keen, MD   50 mcg at 06/16/18 1122  . ferrous sulfate tablet 325 mg  325 mg Oral Q breakfast Mariel Aloe, MD   325 mg at 06/16/18 9030  . fluticasone (FLONASE) 50 MCG/ACT nasal spray 1 spray  1 spray Each Nare Daily Mariel Aloe, MD    1 spray at 06/16/18 0809  . furosemide (LASIX) injection 80 mg  80 mg Intravenous Q12H Corliss Parish, MD   80 mg at 06/16/18 0923  . insulin aspart (novoLOG) injection 0-9 Units  0-9 Units Subcutaneous Q4H Mariel Aloe, MD   1 Units at 06/14/18 2051  . lidocaine (XYLOCAINE) 1 % (with pres) injection    PRN Greggory Keen, MD   10 mL at 06/16/18 1116  . midazolam (VERSED) injection   Intravenous PRN Greggory Keen, MD   1 mg at 06/16/18 1122  . ondansetron (ZOFRAN) injection 4 mg  4 mg Intravenous Q6H PRN Mariel Aloe, MD      . sodium chloride 0.9 % bolus 500 mL  500 mL Intravenous Once Justin Mend, MD      . temazepam (RESTORIL) capsule 7.5 mg  7.5 mg Oral QHS PRN Barton Dubois, MD      . traZODone (DESYREL) tablet 150 mg  150 mg Oral QHS Mariel Aloe,  MD   150 mg at 06/15/18 2226  . ziprasidone (GEODON) capsule 60 mg  60 mg Oral Daily Mariel Aloe, MD   60 mg at 06/15/18 1645    Pt meds include: Statin :Yes Betablocker: No ASA: Yes Other anticoagulants/antiplatelets: None  Past Medical History:  Diagnosis Date  . Asthma   . Bipolar 1 disorder ([place])   . Diabetes mellitus without complication [facility])     Past Surgical History:  Procedure Laterality Date  . HERNIA REPAIR      Social History Social History   Tobacco Use  . Smoking status: Never Smoker  . Smokeless tobacco: Never Used  Substance Use Topics  . Alcohol use: No  . Drug use: Not on file    Family History Family History  Problem Relation Age of Onset  . Renal Disease Neg Hx     No Known Allergies   REVIEW OF SYSTEMS  General: [ ]  Weight loss, [ ]  Fever, [ ]  chills Neurologic: [ ]  Dizziness, [ ]  Blackouts, [ ]  Seizure [ ]  Stroke, [ ]  "Mini stroke", [ ]  Slurred speech, [ ]  Temporary blindness; [ ]  weakness in arms or legs, [ ]  Hoarseness [ ]  Dysphagia [x]  blind Both eyeys Cardiac: [ ]  Chest pain/pressure, [ ]  Shortness of breath at rest [ ]  Shortness of breath with exertion, [ ]   Atrial fibrillation or irregular heartbeat  Vascular: [ ]  Pain in legs with walking, [ ]  Pain in legs at rest, [ ]  Pain in legs at night,  [ ]  Non-healing ulcer, [ ]  Blood clot in vein/DVT,   Pulmonary: [ ]  Home oxygen, [ ]  Productive cough, [ ]  Coughing up blood, [ ]  Asthma,  [ ]  Wheezing [ ]  COPD Musculoskeletal:  [ ]  Arthritis, [ ]  Low back pain, [ ]  Joint pain Hematologic: [ ]  Easy Bruising, [ ]  Anemia; [ ]  Hepatitis Gastrointestinal: [ ]  Blood in stool, [ ]  Gastroesophageal Reflux/heartburn, Urinary: [ ]  chronic Kidney disease, [ x] on HD - [ ]  MWF or [x ] TTHS, [ ]  Burning with urination, [ ]  Difficulty urinating Skin: [ ]  Rashes, [ ]  Wounds Psychological: [ ]  Anxiety, [ ]  Depression  Physical Examination Vitals:   06/16/18 1111 06/16/18 1120 06/16/18 1125 06/16/18 1130  BP: 110/72 113/69 118/68 99/67  Pulse: 71 63 (!) 59 68  Resp: 12 15 10 13   Temp:      TempSrc:      SpO2: 99% 100% 100% 100%  Weight:      Height:       Body mass index is 24.41 kg/m.  General:  WDWN in NAD HENT: WNL Eyes: Pupils equal Pulmonary: normal non-labored breathing , without Rales, rhonchi,  wheezing Cardiac: RRR, without  Murmurs, rubs or gallops; No carotid bruits Abdomen: soft, NT, no masses Skin: no rashes, ulcers noted;  no Gangrene , no cellulitis; no open wounds;   Vascular Exam/Pulses:Palpable radial and brachial pulses B UE   Musculoskeletal: no muscle wasting or atrophy Neurologic: A&O X 3; Appropriate Affect ;  SENSATION: normal; MOTOR FUNCTION: 5/5 Symmetric Speech is fluent/normal   Significant Diagnostic Studies: CBC Lab Results  Component Value Date   WBC 7.8 06/15/2018   HGB 7.9 (L) 06/15/2018   HCT 25.1 (L) 06/15/2018   MCV 79.9 (L) 06/15/2018   PLT 290 06/15/2018    BMET    Component Value Date/Time   NA 135 06/16/2018 0227   K 4.8 06/16/2018 0227   CL 101 06/16/2018 0227  CO2 22 06/16/2018 0227   GLUCOSE 83 06/16/2018 0227   BUN 20 06/16/2018 0227    CREATININE 6.00 (H) 06/16/2018 0227   CALCIUM 7.8 (L) 06/16/2018 0227   GFRNONAA 9 (L) 06/16/2018 0227   GFRAA 11 (L) 06/16/2018 0227   Estimated Creatinine Clearance: 10.1 mL/min (A) (by C-G formula based on SCr of 6 mg/dL (H)).  COAG Lab Results  Component Value Date   INR 1.1 06/15/2018   INR 1.1 06/04/2018     Non-Invasive Vascular Imaging:  Vein mapping has been ordered  ASSESSMENT/PLAN:  ESRD with right TDC placed by IR today He is right hand dominant so we will plan left UE AV fistula verses graft.      Roxy Horseman 06/16/2018 11:31 AM  I agree with the above.  Have seen and evaluated the patient.  Patient is now in renal failure which is multifactorial in the setting of ACE inhibitor's diuretics and superimposed rhabdomyolysis.  In addition, the patient is diabetic.  He is right-handed.  A tunneled dialysis catheter was placed by radiology today.  We discussed proceeding with a left-sided fistula versus graft, pending the results of his vein map.  I discussed the details of the procedure with the patient.  He has a palpable radial pulse.  His operation is been scheduled for tomorrow.  He will be n.p.o. after midnight.  [name]

## 2018-06-16 NOTE — Consult Note (Addendum)
[facility] NOTE   MRN : [id number]  Reason for Consult: ESRD Referring Physician: Dr. Augustin Coupe  History of Present Illness: 63 y/o male presented to [facility] ED 06/04/2018 with nausea, vomiting and progressive weakness.  He was worked up for AKI.  He now requires HD.  IR is placing a TDC today and we have been consulted for permanent access.  No history if ICD.    Past medical history : DM, Blindness and bipolar disorder.       Current Facility-Administered Medications  Medication Dose Route Frequency Provider Last Rate Last Dose  . ceFAZolin (ANCEF) 2-4 GM/100ML-% IVPB           . fentaNYL (SUBLIMAZE) 100 MCG/2ML injection           . gelatin adsorbable (GELFOAM/SURGIFOAM) 12-7 MM sponge 12-7 mm           . heparin 1000 UNIT/ML injection           . lidocaine (XYLOCAINE) 1 % (with pres) injection           . midazolam (VERSED) 2 MG/2ML injection           . acetaminophen (TYLENOL) tablet 650 mg  650 mg Oral Q6H PRN Mariel Aloe, MD       Or  . acetaminophen (TYLENOL) suppository 650 mg  650 mg Rectal Q6H PRN Mariel Aloe, MD      . aspirin chewable tablet 81 mg  81 mg Oral Daily Mariel Aloe, MD   Stopped at 06/16/18 1000  . ceFAZolin (ANCEF) powder    PRN Greggory Keen, MD   1 g at 06/16/18 1112  . Chlorhexidine Gluconate Cloth 2 % PADS 6 each  6 each Topical Q0600 Corliss Parish, MD   6 each at 06/15/18 [phone]  . Darbepoetin Alfa (ARANESP) injection 100 mcg  100 mcg Subcutaneous Q Sat-1800 Corliss Parish, MD   100 mcg at 06/13/18 1648  . divalproex (DEPAKOTE) DR tablet 125 mg  125 mg Oral QHS Mariel Aloe, MD   125 mg at 06/15/18 2230  . fentaNYL (SUBLIMAZE) injection   Intravenous PRN Greggory Keen, MD   50 mcg at 06/16/18 1122  . ferrous sulfate tablet 325 mg  325 mg Oral Q breakfast Mariel Aloe, MD   325 mg at 06/16/18 8185  . fluticasone (FLONASE) 50 MCG/ACT nasal spray 1 spray  1 spray Each Nare Daily Mariel Aloe, MD    1 spray at 06/16/18 0809  . furosemide (LASIX) injection 80 mg  80 mg Intravenous Q12H Corliss Parish, MD   80 mg at 06/16/18 6314  . insulin aspart (novoLOG) injection 0-9 Units  0-9 Units Subcutaneous Q4H Mariel Aloe, MD   1 Units at 06/14/18 2051  . lidocaine (XYLOCAINE) 1 % (with pres) injection    PRN Greggory Keen, MD   10 mL at 06/16/18 1116  . midazolam (VERSED) injection   Intravenous PRN Greggory Keen, MD   1 mg at 06/16/18 1122  . ondansetron (ZOFRAN) injection 4 mg  4 mg Intravenous Q6H PRN Mariel Aloe, MD      . sodium chloride 0.9 % bolus 500 mL  500 mL Intravenous Once Justin Mend, MD      . temazepam (RESTORIL) capsule 7.5 mg  7.5 mg Oral QHS PRN Barton Dubois, MD      . traZODone (DESYREL) tablet 150 mg  150 mg Oral QHS Mariel Aloe,  MD   150 mg at 06/15/18 2226  . ziprasidone (GEODON) capsule 60 mg  60 mg Oral Daily Mariel Aloe, MD   60 mg at 06/15/18 1645    Pt meds include: Statin :Yes Betablocker: No ASA: Yes Other anticoagulants/antiplatelets: None  Past Medical History:  Diagnosis Date  . Asthma   . Bipolar 1 disorder ([place])   . Diabetes mellitus without complication [facility])     Past Surgical History:  Procedure Laterality Date  . HERNIA REPAIR      Social History Social History   Tobacco Use  . Smoking status: Never Smoker  . Smokeless tobacco: Never Used  Substance Use Topics  . Alcohol use: No  . Drug use: Not on file    Family History Family History  Problem Relation Age of Onset  . Renal Disease Neg Hx     No Known Allergies   REVIEW OF SYSTEMS  General: [ ]  Weight loss, [ ]  Fever, [ ]  chills Neurologic: [ ]  Dizziness, [ ]  Blackouts, [ ]  Seizure [ ]  Stroke, [ ]  "Mini stroke", [ ]  Slurred speech, [ ]  Temporary blindness; [ ]  weakness in arms or legs, [ ]  Hoarseness [ ]  Dysphagia [x]  blind Both eyeys Cardiac: [ ]  Chest pain/pressure, [ ]  Shortness of breath at rest [ ]  Shortness of breath with exertion, [ ]   Atrial fibrillation or irregular heartbeat  Vascular: [ ]  Pain in legs with walking, [ ]  Pain in legs at rest, [ ]  Pain in legs at night,  [ ]  Non-healing ulcer, [ ]  Blood clot in vein/DVT,   Pulmonary: [ ]  Home oxygen, [ ]  Productive cough, [ ]  Coughing up blood, [ ]  Asthma,  [ ]  Wheezing [ ]  COPD Musculoskeletal:  [ ]  Arthritis, [ ]  Low back pain, [ ]  Joint pain Hematologic: [ ]  Easy Bruising, [ ]  Anemia; [ ]  Hepatitis Gastrointestinal: [ ]  Blood in stool, [ ]  Gastroesophageal Reflux/heartburn, Urinary: [ ]  chronic Kidney disease, [ x] on HD - [ ]  MWF or [x ] TTHS, [ ]  Burning with urination, [ ]  Difficulty urinating Skin: [ ]  Rashes, [ ]  Wounds Psychological: [ ]  Anxiety, [ ]  Depression  Physical Examination Vitals:   06/16/18 1111 06/16/18 1120 06/16/18 1125 06/16/18 1130  BP: 110/72 113/69 118/68 99/67  Pulse: 71 63 (!) 59 68  Resp: 12 15 10 13   Temp:      TempSrc:      SpO2: 99% 100% 100% 100%  Weight:      Height:       Body mass index is 24.41 kg/m.  General:  WDWN in NAD HENT: WNL Eyes: Pupils equal Pulmonary: normal non-labored breathing , without Rales, rhonchi,  wheezing Cardiac: RRR, without  Murmurs, rubs or gallops; No carotid bruits Abdomen: soft, NT, no masses Skin: no rashes, ulcers noted;  no Gangrene , no cellulitis; no open wounds;   Vascular Exam/Pulses:Palpable radial and brachial pulses B UE   Musculoskeletal: no muscle wasting or atrophy Neurologic: A&O X 3; Appropriate Affect ;  SENSATION: normal; MOTOR FUNCTION: 5/5 Symmetric Speech is fluent/normal   Significant Diagnostic Studies: CBC Lab Results  Component Value Date   WBC 7.8 06/15/2018   HGB 7.9 (L) 06/15/2018   HCT 25.1 (L) 06/15/2018   MCV 79.9 (L) 06/15/2018   PLT 290 06/15/2018    BMET    Component Value Date/Time   NA 135 06/16/2018 0227   K 4.8 06/16/2018 0227   CL 101 06/16/2018 0227  CO2 22 06/16/2018 0227   GLUCOSE 83 06/16/2018 0227   BUN 20 06/16/2018 0227    CREATININE 6.00 (H) 06/16/2018 0227   CALCIUM 7.8 (L) 06/16/2018 0227   GFRNONAA 9 (L) 06/16/2018 0227   GFRAA 11 (L) 06/16/2018 0227   Estimated Creatinine Clearance: 10.1 mL/min (A) (by C-G formula based on SCr of 6 mg/dL (H)).  COAG Lab Results  Component Value Date   INR 1.1 06/15/2018   INR 1.1 06/04/2018     Non-Invasive Vascular Imaging:  Vein mapping has been ordered  ASSESSMENT/PLAN:  ESRD with right TDC placed by IR today He is right hand dominant so we will plan left UE AV fistula verses graft.      Roxy Horseman 06/16/2018 11:31 AM  I agree with the above.  Have seen and evaluated the patient.  Patient is now in renal failure which is multifactorial in the setting of ACE inhibitor's diuretics and superimposed rhabdomyolysis.  In addition, the patient is diabetic.  He is right-handed.  A tunneled dialysis catheter was placed by radiology today.  We discussed proceeding with a left-sided fistula versus graft, pending the results of his vein map.  I discussed the details of the procedure with the patient.  He has a palpable radial pulse.  His operation is been scheduled for tomorrow.  He will be n.p.o. after midnight.  [name]

## 2018-06-16 NOTE — Progress Notes (Signed)
PROGRESS NOTE    Chad Mathis  OHY:[id number] DOB: 08/13/1955 DOA: 06/04/2018 PCP: Verner Chol, MD    Brief Narrative:  63 y.o.malewith medical history significant ofdiabetes, bipolar disorder.Patient reports nausea and vomiting for the last 3 days. The symptoms have worsened. He has not been able to tolerate a diet. He has not taken anything to help with the symptoms. No associated chest pain, shortness of breath, abdominal pain, diarrhea. He reports decreased urine output since Monday.  Assessment & Plan:   Principal Problem:   Acute renal failure (HCC) Active Problems:   Diabetes mellitus (HCC)   SIRS (systemic inflammatory response syndrome) (HCC)  Acute on chronic renal failure: Stage III at baseline secondary to diabetes, now ESRD -Remains oliguric on exam -Creatinine peaked to over 10 -Electrolytes remain stable at this time -Nephrology cont to follow -No signs of recovery at this time. [place] placed 6/9. Anticipate arranging outpatient HD, then SNF on d/c  SIRS -Currently afebrile and with normal WBC's -COVID-19 neg -Continue to follow final culture results (no growth or microorganism isolated) -Continue monitoring off antibiotics -patient remains stable  Nausea/vomiting -Secondary to uremia versus underlying viral gastroenteritis -Continue as needed antiemetics -resolved -Tolerating diet this afternoon  Hyperkalemia/hypokalemia -In the setting of acute renal failure -Improved and resolved after hemodialysis -Patient also receive bicarbonate, Lokelma and calcium gluconate at time of admission. -Continue to follow electrolytes and further replete as needed.     Type II diabetes mellitus with nephropathy -Continue sliding scale insulin -Follow CBGs and adjust hypoglycemic regimen as needed -Continue holding oral hypoglycemic agents. -Glucose trends remain stable, labs reviewed  Bipolar disorder/schizophrenia -Continue Depakote and Geodon as  tolerated -No recent suicidal ideation or hallucinations noted  Elevated transaminitis -No prior history of liver disease -Continue holding statins -Most likely associated with shock liver and rhabdomyolysis. -CK trended down, labs reviewed  Essential HTN -continue holding antihypertensive agents -Vitals reviewed. BP stable and controlled  Urinary retention -Foley was d/c'd -no significant urine out put noted  Insomnia -will continue the use of Restoril as needed.   DVT prophylaxis: Heparin subQ Code Status: Full Family Communication: Pt in room, family not at bedside Disposition Plan: Uncertain at this time  Consultants:   Nephrology  Vascular Surgery  Procedures:   Temporary Dialysis Cath placed 6/9  Antimicrobials: Anti-infectives (From admission, onward)   Start     Dose/Rate Route Frequency Ordered Stop   06/16/18 1112  ceFAZolin (ANCEF) 2-4 GM/100ML-% IVPB    Note to Pharmacy:  Arlean Hopping   : cabinet override      06/16/18 1112 06/16/18 2314   06/16/18 1112  ceFAZolin (ANCEF) powder       As needed 06/16/18 1125 06/16/18 1112   06/04/18 2200  piperacillin-tazobactam (ZOSYN) IVPB 2.25 g  Status:  Discontinued     2.25 g 100 mL/hr over 30 Minutes Intravenous Every 8 hours 06/04/18 1250 06/06/18 1846   06/04/18 1300  piperacillin-tazobactam (ZOSYN) IVPB 3.375 g     3.375 g 100 mL/hr over 30 Minutes Intravenous  Once 06/04/18 1250 06/04/18 1432   06/04/18 1100  cefTRIAXone (ROCEPHIN) 1 g in sodium chloride 0.9 % 100 mL IVPB     1 g 200 mL/hr over 30 Minutes Intravenous  Once 06/04/18 1054 06/04/18 1215      Subjective: No complaints this AM  Objective: Vitals:   06/16/18 1200 06/16/18 1230 06/16/18 1300 06/16/18 1321  BP: 99/66 (!) 107/59 94/66 112/65  Pulse: 62 60  68  Resp: 12  14 16 18   Temp: 98.5 F (36.9 C) 98.7 F (37.1 C) 98.5 F (36.9 C) 98.4 F (36.9 C)  TempSrc: Oral Oral Oral Oral  SpO2: 100% 99% 100% 100%  Weight:        Height:       No intake or output data in the 24 hours ending 06/16/18 1447 Filed Weights   06/11/18 0949 06/15/18 0651 06/15/18 1105  Weight: 66.5 kg 66.9 kg 62.5 kg    Examination: General exam: Awake, laying in bed, in nad Respiratory system: Normal respiratory effort, no wheezing Cardiovascular system: regular rate, s1, s2 Gastrointestinal system: Soft, nondistended, positive BS Central nervous system: CN2-12 grossly intact, strength intact Extremities: Perfused, no clubbing Skin: Normal skin turgor, no notable skin lesions seen Psychiatry: Mood normal // no visual hallucinations   Data Reviewed: I have personally reviewed following labs and imaging studies  CBC: Recent Labs  Lab 06/13/18 0411 06/15/18 0746  WBC 5.9 7.8  HGB 8.1* 7.9*  HCT 25.5* 25.1*  MCV 78.5* 79.9*  PLT 264 010   Basic Metabolic Panel: Recent Labs  Lab 06/13/18 0411 06/14/18 0450 06/15/18 0408 06/15/18 0746 06/16/18 0227  NA 138 138 138 138 135  K 3.6 3.4* 3.3* 3.3* 4.8  CL 104 103 105 104 101  CO2 22 21* 22 22 22   GLUCOSE 88 91 95 84 83  BUN 32* 40* 44* 44* 20  CREATININE 8.09* 9.08* 10.32* 10.14* 6.00*  CALCIUM 8.2* 8.4* 8.1* 8.1* 7.8*  PHOS 4.7* 5.5* 6.0* 6.2* 3.6   GFR: Estimated Creatinine Clearance: 10.1 mL/min (A) (by C-G formula based on SCr of 6 mg/dL (H)). Liver Function Tests: Recent Labs  Lab 06/13/18 0411 06/14/18 0450 06/15/18 0408 06/15/18 0746 06/16/18 0227  ALBUMIN 2.4* 2.4* 2.2* 2.1* 2.3*   No results for input(s): LIPASE, AMYLASE in the last 168 hours. No results for input(s): AMMONIA in the last 168 hours. Coagulation Profile: Recent Labs  Lab 06/15/18 1420  INR 1.1   Cardiac Enzymes: Recent Labs  Lab 06/11/18 0453  CKTOTAL 5,411*   BNP (last 3 results) No results for input(s): PROBNP in the last 8760 hours. HbA1C: No results for input(s): HGBA1C in the last 72 hours. CBG: Recent Labs  Lab 06/15/18 2033 06/16/18 0000 06/16/18 0612  06/16/18 0749 06/16/18 1218  GLUCAP 85 80 76 76 79   Lipid Profile: No results for input(s): CHOL, HDL, LDLCALC, TRIG, CHOLHDL, LDLDIRECT in the last 72 hours. Thyroid Function Tests: No results for input(s): TSH, T4TOTAL, FREET4, T3FREE, THYROIDAB in the last 72 hours. Anemia Panel: No results for input(s): VITAMINB12, FOLATE, FERRITIN, TIBC, IRON, RETICCTPCT in the last 72 hours. Sepsis Labs: No results for input(s): PROCALCITON, LATICACIDVEN in the last 168 hours.  No results found for this or any previous visit (from the past 240 hour(s)).   Radiology Studies: Ir Fluoro Guide Cv Line Right  Result Date: 06/16/2018 INDICATION: ACUTE KIDNEY INJURY EXAM: ULTRASOUND GUIDANCE FOR VASCULAR ACCESS RIGHT INTERNAL JUGULAR PERMANENT HEMODIALYSIS CATHETER Date:  06/16/2018 06/16/2018 11:53 am Radiologist:  M. Daryll Brod, MD Guidance:  Ultrasound fluoroscopic FLUOROSCOPY TIME:  Fluoroscopy Time: 2 minutes 6 seconds (4 mGy). MEDICATIONS: Ancef 2 g 1 the procedure ANESTHESIA/SEDATION: Versed 1.0 mg IV; Fentanyl 50 mcg IV; Moderate Sedation Time:  20 minutes The patient was continuously monitored during the procedure by the interventional radiology nurse under my direct supervision. CONTRAST:  None. COMPLICATIONS: None immediate. PROCEDURE: Informed consent was obtained from the patient following explanation of the procedure, risks,  benefits and alternatives. The patient understands, agrees and consents for the procedure. All questions were addressed. A time out was performed. Maximal barrier sterile technique utilized including caps, mask, sterile gowns, sterile gloves, large sterile drape, hand hygiene, and 2% chlorhexidine scrub. Under sterile conditions and local anesthesia, right internal jugular micropuncture venous access was performed with ultrasound. Images were obtained for documentation of the patent right internal jugular vein. A guide wire was inserted followed by a transitional dilator. Next, a  0.035 guidewire was advanced into the IVC with a 5-French catheter. Measurements were obtained from the right venotomy site to the proximal right atrium. In the infraclavicular chest, a subcutaneous tunnel was created under sterile conditions and local anesthesia. 1% lidocaine with epinephrine was utilized for this. The 19 cm tip to cuff palindrome catheter was tunneled subcutaneously to the venotomy site and inserted into the SVC/RA junction through a valved peel-away sheath. Position was confirmed with fluoroscopy. Images were obtained for documentation. Blood was aspirated from the catheter followed by saline and heparin flushes. The appropriate volume and strength of heparin was instilled in each lumen. Caps were applied. The catheter was secured at the tunnel site with Gelfoam and a pursestring suture. The venotomy site was closed with subcuticular Vicryl suture. Dermabond was applied to the small right neck incision. A dry sterile dressing was applied. The catheter is ready for use. No immediate complications. IMPRESSION: Ultrasound and fluoroscopically guided right internal jugular tunneled hemodialysis catheter (19 cm tip cuff palindrome catheter). Electronically Signed   By: Jerilynn Mages.  Shick M.D.   On: 06/16/2018 12:28   Ir US Guide Vasc Access Right  Result Date: 06/16/2018 INDICATION: ACUTE KIDNEY INJURY EXAM: ULTRASOUND GUIDANCE FOR VASCULAR ACCESS RIGHT INTERNAL JUGULAR PERMANENT HEMODIALYSIS CATHETER Date:  06/16/2018 06/16/2018 11:53 am Radiologist:  Jerilynn Mages. Daryll Brod, MD Guidance:  Ultrasound fluoroscopic FLUOROSCOPY TIME:  Fluoroscopy Time: 2 minutes 6 seconds (4 mGy). MEDICATIONS: Ancef 2 g 1 the procedure ANESTHESIA/SEDATION: Versed 1.0 mg IV; Fentanyl 50 mcg IV; Moderate Sedation Time:  20 minutes The patient was continuously monitored during the procedure by the interventional radiology nurse under my direct supervision. CONTRAST:  None. COMPLICATIONS: None immediate. PROCEDURE: Informed consent was  obtained from the patient following explanation of the procedure, risks, benefits and alternatives. The patient understands, agrees and consents for the procedure. All questions were addressed. A time out was performed. Maximal barrier sterile technique utilized including caps, mask, sterile gowns, sterile gloves, large sterile drape, hand hygiene, and 2% chlorhexidine scrub. Under sterile conditions and local anesthesia, right internal jugular micropuncture venous access was performed with ultrasound. Images were obtained for documentation of the patent right internal jugular vein. A guide wire was inserted followed by a transitional dilator. Next, a 0.035 guidewire was advanced into the IVC with a 5-French catheter. Measurements were obtained from the right venotomy site to the proximal right atrium. In the infraclavicular chest, a subcutaneous tunnel was created under sterile conditions and local anesthesia. 1% lidocaine with epinephrine was utilized for this. The 19 cm tip to cuff palindrome catheter was tunneled subcutaneously to the venotomy site and inserted into the SVC/RA junction through a valved peel-away sheath. Position was confirmed with fluoroscopy. Images were obtained for documentation. Blood was aspirated from the catheter followed by saline and heparin flushes. The appropriate volume and strength of heparin was instilled in each lumen. Caps were applied. The catheter was secured at the tunnel site with Gelfoam and a pursestring suture. The venotomy site was closed with subcuticular Vicryl  suture. Dermabond was applied to the small right neck incision. A dry sterile dressing was applied. The catheter is ready for use. No immediate complications. IMPRESSION: Ultrasound and fluoroscopically guided right internal jugular tunneled hemodialysis catheter (19 cm tip cuff palindrome catheter). Electronically Signed   By: Jerilynn Mages.  Shick M.D.   On: 06/16/2018 12:28    Scheduled Meds:  aspirin  81 mg Oral  Daily   Chlorhexidine Gluconate Cloth  6 each Topical Q0600   darbepoetin (ARANESP) injection - NON-DIALYSIS  100 mcg Subcutaneous Q Sat-1800   divalproex  125 mg Oral QHS   fentaNYL       ferrous sulfate  325 mg Oral Q breakfast   fluticasone  1 spray Each Nare Daily   furosemide  80 mg Intravenous Q12H   gelatin adsorbable       heparin       insulin aspart  0-9 Units Subcutaneous Q4H   lidocaine       midazolam       traZODone  150 mg Oral QHS   ziprasidone  60 mg Oral Daily   Continuous Infusions:  ceFAZolin     sodium chloride       LOS: 12 days   Marylu Lund, MD [facility] Pager On Amion  If 7PM-7AM, please contact night-coverage 06/16/2018, 2:47 PM

## 2018-06-16 NOTE — Care Management Important Message (Signed)
Important Message  Patient Details  Name: JONHATAN HEARTY MRN: [id number] Date of Birth: 05/22/1955   Medicare Important Message Given:  Yes    Orbie Pyo 06/16/2018, 3:22 PM

## 2018-06-16 NOTE — Progress Notes (Signed)
[facility] Progress Note    Assessment/ Plan:   1. AKI/CKD stage 3(last known Cr was 1.42 in Dec 2017). Oliguric presumably multifactorial; volume depletion due to N/V in setting of concomitant ACE inhibition and diureticswith superimposed rhabdomyolysis. s/p HD on 06/04/18 due to hyperkalemia. [place] without hydro but did show increased echogenicity consistent with chronic medical renal disease.  1. Continued to hold benazepril, hctz, and glucophage 2. Avoid nephrotoxic agents 3. stopped IVF -CK finally trending down  4. remains oliguric  Did HD 6/4 as felt badly and numbers do continue to worsen off HD vascath placed 5/28   Last HD Monday 6/8  Bladder scan 6/8 only 142ml.  No signs of recovery and w/ h/o CKD IIIB in 2017 likely represents progression with poor reserve + rhabdo  -> will convert to [facility]  + permanent shunt and CLIP. Contacted [facility] (Appreciate Dr. Trula Slade seeing the pt).   2. Metabolic acidosis- better. Will stop nahco3 as well. 3. Rhabdomyolysis- likely contributing to AKI but unclear etiology other than statin therapy. No history of falls or being found down.  follow levels, trending down 4. Anemia- presumably due to CKD, however unknown baseline Cr.  SPEP/UPEP neg, iron stores OK and follow. Started ESA 5. 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 SIRS- completed zosyn - covid-19 negative  6. DM- per primary svc 7. HTN- low BP's on arrival, meds on hold  Subjective:   Denies f/c/n/v/dyspnea/cp   Objective:   BP 111/64 (BP Location: Left Arm)   Pulse 71   Temp 98.8 F (37.1 C)   Resp 16   Wt 62.5 kg   SpO2 100%   BMI 24.41 kg/m   Intake/Output Summary (Last 24 hours) at 06/16/2018 0843 Last data filed at 06/15/2018 1332 Gross per 24 hour  Intake 50 ml  Output 1800 ml  Net -1750 ml   Weight change: -4.4 kg  Physical Exam: Gen:NAD CVS: no rub  Resp: cta Abd: benign Ext: pitting edema Access: RIJ temp  Imaging: No results  found.  Labs: BMET Recent Labs  Lab 06/11/18 0453 06/12/18 0317 06/13/18 0411 06/14/18 0450 06/15/18 0408 06/15/18 0746 06/16/18 0227  NA 138 138 138 138 138 138 135  K 4.1 3.4* 3.6 3.4* 3.3* 3.3* 4.8  CL 108 104 104 103 105 104 101  CO2 17* 23 22 21* 22 22 22   GLUCOSE 90 88 88 91 95 84 83  BUN 50* 26* 32* 40* 44* 44* 20  CREATININE 9.75* 6.94* 8.09* 9.08* 10.32* 10.14* 6.00*  CALCIUM 8.6* 7.9* 8.2* 8.4* 8.1* 8.1* 7.8*  PHOS 5.8* 4.1 4.7* 5.5* 6.0* 6.2* 3.6   CBC Recent Labs  Lab 06/13/18 0411 06/15/18 0746  WBC 5.9 7.8  HGB 8.1* 7.9*  HCT 25.5* 25.1*  MCV 78.5* 79.9*  PLT 264 290    Medications:    . aspirin  81 mg Oral Daily  . Chlorhexidine Gluconate Cloth  6 each Topical Q0600  . darbepoetin (ARANESP) injection - NON-DIALYSIS  100 mcg Subcutaneous Q Sat-1800  . divalproex  125 mg Oral QHS  . ferrous sulfate  325 mg Oral Q breakfast  . fluticasone  1 spray Each Nare Daily  . furosemide  80 mg Intravenous Q12H  . insulin aspart  0-9 Units Subcutaneous Q4H  . sodium bicarbonate  650 mg Oral BID  . traZODone  150 mg Oral QHS  . ziprasidone  60 mg Oral Daily      Otelia Santee, MD 06/16/2018, 8:43 AM

## 2018-06-17 ENCOUNTER — Encounter (HOSPITAL_COMMUNITY)
Admission: EM | Disposition: A | Discharge status: Skilled Nursing Facility | Payer: Self-pay | Source: Home / Self Care | Attending: Internal Medicine

## 2018-06-17 ENCOUNTER — Inpatient Hospital Stay (HOSPITAL_COMMUNITY): Payer: Medicare HMO | Admitting: Anesthesiology

## 2018-06-17 ENCOUNTER — Inpatient Hospital Stay (HOSPITAL_COMMUNITY): Payer: Medicare HMO

## 2018-06-17 ENCOUNTER — Encounter (HOSPITAL_COMMUNITY): Payer: Self-pay | Admitting: Anesthesiology

## 2018-06-17 DIAGNOSIS — Z01810 Encounter for preprocedural cardiovascular examination: Secondary | ICD-10-CM

## 2018-06-17 DIAGNOSIS — M6282 Rhabdomyolysis: Secondary | ICD-10-CM

## 2018-06-17 DIAGNOSIS — N185 Chronic kidney disease, stage 5: Secondary | ICD-10-CM

## 2018-06-17 HISTORY — PX: AV FISTULA PLACEMENT: SHX1204

## 2018-06-17 LAB — BASIC METABOLIC PANEL
Anion gap: 11 (ref 5–15)
BUN: 24 mg/dL — ABNORMAL HIGH (ref 8–23)
CO2: 24 mmol/L (ref 22–32)
Calcium: 7.9 mg/dL — ABNORMAL LOW (ref 8.9–10.3)
Chloride: 101 mmol/L (ref 98–111)
Creatinine, Ser: 6.93 mg/dL — ABNORMAL HIGH (ref 0.61–1.24)
GFR calc Af Amer: 9 mL/min — ABNORMAL LOW (ref >60)
GFR calc non Af Amer: 8 mL/min — ABNORMAL LOW (ref >60)
Glucose, Bld: 102 mg/dL — ABNORMAL HIGH (ref 70–99)
Potassium: 3.2 mmol/L — ABNORMAL LOW (ref 3.5–5.1)
Sodium: 136 mmol/L (ref 135–145)

## 2018-06-17 LAB — CBC
HCT: 25.2 % — ABNORMAL LOW (ref 39.0–52.0)
Hemoglobin: 8 g/dL — ABNORMAL LOW (ref 13.0–17.0)
MCH: 25.1 pg — ABNORMAL LOW (ref 26.0–34.0)
MCHC: 31.7 g/dL (ref 30.0–36.0)
MCV: 79 fL — ABNORMAL LOW (ref 80.0–100.0)
Platelets: 232 10*3/uL (ref 150–400)
RBC: 3.19 MIL/uL — ABNORMAL LOW (ref 4.22–5.81)
RDW: 13.4 % (ref 11.5–15.5)
WBC: 9.4 10*3/uL (ref 4.0–10.5)
nRBC: 0 % (ref 0.0–0.2)

## 2018-06-17 LAB — GLUCOSE, CAPILLARY
Glucose-Capillary: 79 mg/dL (ref 70–99)
Glucose-Capillary: 85 mg/dL (ref 70–99)
Glucose-Capillary: 86 mg/dL (ref 70–99)
Glucose-Capillary: 88 mg/dL (ref 70–99)
Glucose-Capillary: 92 mg/dL (ref 70–99)
Glucose-Capillary: 93 mg/dL (ref 70–99)

## 2018-06-17 LAB — RENAL FUNCTION PANEL
Albumin: 2.3 g/dL — ABNORMAL LOW (ref 3.5–5.0)
Anion gap: 12 (ref 5–15)
BUN: 24 mg/dL — ABNORMAL HIGH (ref 8–23)
CO2: 24 mmol/L (ref 22–32)
Calcium: 7.9 mg/dL — ABNORMAL LOW (ref 8.9–10.3)
Chloride: 100 mmol/L (ref 98–111)
Creatinine, Ser: 6.96 mg/dL — ABNORMAL HIGH (ref 0.61–1.24)
GFR calc Af Amer: 9 mL/min — ABNORMAL LOW (ref >60)
GFR calc non Af Amer: 8 mL/min — ABNORMAL LOW (ref >60)
Glucose, Bld: 102 mg/dL — ABNORMAL HIGH (ref 70–99)
Phosphorus: 4.2 mg/dL (ref 2.5–4.6)
Potassium: 3.2 mmol/L — ABNORMAL LOW (ref 3.5–5.1)
Sodium: 136 mmol/L (ref 135–145)

## 2018-06-17 LAB — PROTIME-INR
INR: 1.1 (ref 0.8–1.2)
Prothrombin Time: 14 seconds (ref 11.4–15.2)

## 2018-06-17 SURGERY — ARTERIOVENOUS (AV) FISTULA CREATION
Anesthesia: General | Site: Arm Upper | Laterality: Left

## 2018-06-17 MED ORDER — LIDOCAINE-EPINEPHRINE (PF) 1 %-1:200000 IJ SOLN
INTRAMUSCULAR | Status: AC
  Filled 2018-06-17: qty 30

## 2018-06-17 MED ORDER — PROPOFOL 10 MG/ML IV BOLUS
INTRAVENOUS | Status: AC
  Filled 2018-06-17: qty 20

## 2018-06-17 MED ORDER — SODIUM CHLORIDE 0.9 % IV SOLN
INTRAVENOUS | Status: AC
  Filled 2018-06-17: qty 1.2

## 2018-06-17 MED ORDER — ONDANSETRON HCL 4 MG/2ML IJ SOLN
INTRAMUSCULAR | Status: DC | PRN
  Administered 2018-06-17: 4 mg via INTRAVENOUS

## 2018-06-17 MED ORDER — SODIUM CHLORIDE 0.9 % IV SOLN
INTRAVENOUS | Status: DC | PRN
  Administered 2018-06-17: 500 mL

## 2018-06-17 MED ORDER — CHLORHEXIDINE GLUCONATE 4 % EX LIQD
60.0000 mL | Freq: Once | CUTANEOUS | Status: AC
  Filled 2018-06-17: qty 60

## 2018-06-17 MED ORDER — SODIUM CHLORIDE 0.9 % IV SOLN
INTRAVENOUS | Status: DC | PRN
  Administered 2018-06-17: 12:00:00 50 ug/min via INTRAVENOUS

## 2018-06-17 MED ORDER — SODIUM CHLORIDE 0.9 % IV SOLN
INTRAVENOUS | Status: DC
  Administered 2018-06-17: 12:00:00 via INTRAVENOUS

## 2018-06-17 MED ORDER — EPHEDRINE SULFATE-NACL 50-0.9 MG/10ML-% IV SOSY
PREFILLED_SYRINGE | INTRAVENOUS | Status: DC | PRN
  Administered 2018-06-17: 5 mg via INTRAVENOUS
  Administered 2018-06-17: 10 mg via INTRAVENOUS

## 2018-06-17 MED ORDER — HEPARIN SODIUM (PORCINE) 1000 UNIT/ML IJ SOLN
INTRAMUSCULAR | Status: DC | PRN
  Administered 2018-06-17: 3000 [IU] via INTRAVENOUS

## 2018-06-17 MED ORDER — LIDOCAINE 2% (20 MG/ML) 5 ML SYRINGE
INTRAMUSCULAR | Status: AC
  Filled 2018-06-17: qty 5

## 2018-06-17 MED ORDER — CHLORHEXIDINE GLUCONATE 4 % EX LIQD
60.0000 mL | Freq: Once | CUTANEOUS | Status: AC
  Administered 2018-06-17: 4 via TOPICAL
  Filled 2018-06-17: qty 60

## 2018-06-17 MED ORDER — PHENYLEPHRINE 40 MCG/ML (10ML) SYRINGE FOR IV PUSH (FOR BLOOD PRESSURE SUPPORT)
PREFILLED_SYRINGE | INTRAVENOUS | Status: DC | PRN
  Administered 2018-06-17: 120 ug via INTRAVENOUS

## 2018-06-17 MED ORDER — HEMOSTATIC AGENTS (NO CHARGE) OPTIME
TOPICAL | Status: DC | PRN
  Administered 2018-06-17: 1 via TOPICAL

## 2018-06-17 MED ORDER — MIDAZOLAM HCL 2 MG/2ML IJ SOLN
INTRAMUSCULAR | Status: AC
  Filled 2018-06-17: qty 2

## 2018-06-17 MED ORDER — FENTANYL CITRATE (PF) 100 MCG/2ML IJ SOLN
INTRAMUSCULAR | Status: DC | PRN
  Administered 2018-06-17: 25 ug via INTRAVENOUS

## 2018-06-17 MED ORDER — 0.9 % SODIUM CHLORIDE (POUR BTL) OPTIME
TOPICAL | Status: DC | PRN
  Administered 2018-06-17: 1000 mL

## 2018-06-17 MED ORDER — FENTANYL CITRATE (PF) 250 MCG/5ML IJ SOLN
INTRAMUSCULAR | Status: AC
  Filled 2018-06-17: qty 5

## 2018-06-17 MED ORDER — LIDOCAINE 2% (20 MG/ML) 5 ML SYRINGE
INTRAMUSCULAR | Status: DC | PRN
  Administered 2018-06-17: 60 mg via INTRAVENOUS

## 2018-06-17 MED ORDER — ONDANSETRON HCL 4 MG/2ML IJ SOLN
INTRAMUSCULAR | Status: AC
  Filled 2018-06-17: qty 2

## 2018-06-17 MED ORDER — EPHEDRINE 5 MG/ML INJ
INTRAVENOUS | Status: AC
  Filled 2018-06-17: qty 10

## 2018-06-17 MED ORDER — PROPOFOL 10 MG/ML IV BOLUS
INTRAVENOUS | Status: DC | PRN
  Administered 2018-06-17: 120 mg via INTRAVENOUS
  Administered 2018-06-17: 20 mg via INTRAVENOUS

## 2018-06-17 MED ORDER — PROTAMINE SULFATE 10 MG/ML IV SOLN
INTRAVENOUS | Status: DC | PRN
  Administered 2018-06-17: 25 mg via INTRAVENOUS

## 2018-06-17 MED ORDER — MIDAZOLAM HCL 5 MG/5ML IJ SOLN
INTRAMUSCULAR | Status: DC | PRN
  Administered 2018-06-17: 1 mg via INTRAVENOUS

## 2018-06-17 MED ORDER — HYDROCODONE-ACETAMINOPHEN 5-325 MG PO TABS
1.0000 | ORAL_TABLET | ORAL | Status: DC | PRN
  Administered 2018-06-18: 1 via ORAL
  Filled 2018-06-17: qty 1

## 2018-06-17 MED ORDER — PHENYLEPHRINE 40 MCG/ML (10ML) SYRINGE FOR IV PUSH (FOR BLOOD PRESSURE SUPPORT)
PREFILLED_SYRINGE | INTRAVENOUS | Status: AC
  Filled 2018-06-17: qty 10

## 2018-06-17 SURGICAL SUPPLY — 31 items
ADH SKN CLS APL DERMABOND .7 (GAUZE/BANDAGES/DRESSINGS) ×1
ARMBAND PINK RESTRICT EXTREMIT (MISCELLANEOUS) ×6 IMPLANT
CANISTER SUCT 3000ML PPV (MISCELLANEOUS) ×3 IMPLANT
CLIP VESOCCLUDE MED 6/CT (CLIP) ×3 IMPLANT
CLIP VESOCCLUDE SM WIDE 6/CT (CLIP) ×3 IMPLANT
COVER PROBE W GEL 5X96 (DRAPES) ×3 IMPLANT
COVER WAND RF STERILE (DRAPES) ×3 IMPLANT
DERMABOND ADVANCED (GAUZE/BANDAGES/DRESSINGS) ×2
DERMABOND ADVANCED .7 DNX12 (GAUZE/BANDAGES/DRESSINGS) ×1 IMPLANT
ELECT REM PT RETURN 9FT ADLT (ELECTROSURGICAL) ×3
ELECTRODE REM PT RTRN 9FT ADLT (ELECTROSURGICAL) ×1 IMPLANT
GLOVE BIOGEL PI IND STRL 7.5 (GLOVE) ×1 IMPLANT
GLOVE BIOGEL PI INDICATOR 7.5 (GLOVE) ×2
GLOVE SURG SS PI 7.5 STRL IVOR (GLOVE) ×3 IMPLANT
GOWN STRL REUS W/ TWL LRG LVL3 (GOWN DISPOSABLE) ×2 IMPLANT
GOWN STRL REUS W/ TWL XL LVL3 (GOWN DISPOSABLE) ×1 IMPLANT
GOWN STRL REUS W/TWL LRG LVL3 (GOWN DISPOSABLE) ×6
GOWN STRL REUS W/TWL XL LVL3 (GOWN DISPOSABLE) ×3
HEMOSTAT SNOW SURGICEL 2X4 (HEMOSTASIS) ×2 IMPLANT
KIT BASIN OR (CUSTOM PROCEDURE TRAY) ×3 IMPLANT
KIT TURNOVER KIT B (KITS) ×3 IMPLANT
NS IRRIG 1000ML POUR BTL (IV SOLUTION) ×3 IMPLANT
PACK CV ACCESS (CUSTOM PROCEDURE TRAY) ×3 IMPLANT
PAD ARMBOARD 7.5X6 YLW CONV (MISCELLANEOUS) ×6 IMPLANT
SUT PROLENE 6 0 CC (SUTURE) ×3 IMPLANT
SUT VIC AB 3-0 SH 27 (SUTURE) ×3
SUT VIC AB 3-0 SH 27X BRD (SUTURE) ×1 IMPLANT
SUT VICRYL 4-0 PS2 18IN ABS (SUTURE) ×3 IMPLANT
TOWEL GREEN STERILE (TOWEL DISPOSABLE) ×3 IMPLANT
UNDERPAD 30X30 (UNDERPADS AND DIAPERS) ×3 IMPLANT
WATER STERILE IRR 1000ML POUR (IV SOLUTION) ×3 IMPLANT

## 2018-06-17 NOTE — Progress Notes (Signed)
PT Cancellation Note  Patient Details Name: Chad Mathis MRN: [id number] DOB: February 24, 1955   Cancelled Treatment:      Attempted to see patient for treatment. He was off the floor for Dialysis port placement. Therapy will follow up on 06/18/2018 as able     Carney Living PT DPT  06/17/2018, 1:41 PM

## 2018-06-17 NOTE — Op Note (Signed)
    Patient name: Chad Mathis MRN: [id number] DOB: [birth year] Sex: male  06/17/2018 Pre-operative Diagnosis: ESRD Post-operative diagnosis:  Same Surgeon:  [name] Assistants:  Arlee Muslim Procedure:   First stage left basilic vein fistula creation Anesthesia: General Blood Loss: Minimal Specimens: None  Findings: 3 mm brachial artery, 4 mm basilic vein  Indications: The patient had a tunneled dialysis catheter placed for new start dialysis yesterday by radiology.  He comes in today for fistula creation.  Procedure:  The patient was identified in the holding area and taken to [place] [facility]  The patient was then placed supine on the table. general anesthesia was administered.  The patient was prepped and draped in the usual sterile fashion.  A time out was called and antibiotics were administered.  Ultrasound was used to evaluate the cephalic and basilic vein in the left upper arm.  The basilic vein appeared to be adequate for fistula creation.  An oblique incision was made just proximal to the antecubital crease.  I first dissected out the brachial artery which was a 3 mm disease-free artery.  It was isolated proximally and distally with Vesseloops.  I then dissected out the basilic vein.  This was a healthy 3-4 mm vein.  Nervous structures were protected.  Once the vein was fully mobilized, it was marked with an ink pen for orientation and then ligated distally with 3-0 silk tie.  The vein distended nicely.  3000 units of heparin was given.  The brachial artery was then occluded.  A #11 blade was used to make an arteriotomy which was extended longitudinally with Potts scissors.  The vein was cut to the appropriate length and spatulated to fit the size of the arteriotomy.  A running anastomosis was created with 6-0 Prolene.  Prior to completion the appropriate flushing maneuvers were performed and the anastomosis was completed.  There was a audible thrill by hand-held Doppler within the  vein throughout the upper arm.  There was a brisk radial artery and ulnar artery Doppler signal at the wrist.  25 mg of protamine was then given.  I inspected the course of the vein to make sure there were no kinks.  Hemostasis was achieved.  The wound was then closed with 2 layers with 3-0 Vicryl followed by Dermabond.  There were no immediate complications.   Disposition: To PACU stable.   Theotis Burrow, M.D., [facility] [place] Office: [phone] Pager:  [phone]

## 2018-06-17 NOTE — Progress Notes (Signed)
PROGRESS NOTE    Chad Mathis   SJG:[id number]  DOB: 1955/10/28  DOA: 06/04/2018 PCP: Chad Chol, MD   Brief Narrative:  Chad Mathis 63 y.o.malewith medical history significant ofdiabetes, bipolar disorder, legally blind.Patient reports nausea, vomiting and weakness for the last 3 days. He continued to take his home meds including HCTZ, Benazepril and Metformin.   CBG was 61.  Cr 8.86, K was 7.0, Lactic acid was 7.5 Was Tachypneic and tachycardic.  WBC  Was 13.5.   Subjective: No complaints.     Assessment & Plan:   Principal Problem:   Acute renal failure - Cr was 1.42 in 2017 and then 8.86 when admitted - started on HD on 5/28 - renal ultrasound consistent with chronic medical renal disease - received tunneled cath on 6/9  And left basilic fistula created today - awaiting CLIP procedure  Active Problems: Rhabdomyolysis - CK 12,563 on 5/29- improved to 5,411 by 6/4 - cause uncertain  Elevated LFTs - ? Due to rhabdomyolysis- will recheck    Diabetes mellitus 2 - holding Glucophage - A1c 5.9 on 06/05/18  Bipolar disorder - cont Depakote and Geodon  Essential HTN - currently hypotensive   Anemia of chronic disease - Hb ~ 8 and stable   Time spent in minutes:  40 min in reviewing chart DVT prophylaxis:  Heparin  Code Status: Full code Family Communication:   Disposition Plan: needs CLIP prior to d/c Consultants:    Nephro  Vascular surgery Procedures:   Temp and tunneled Dialysis cath  fistula Antimicrobials:  Anti-infectives (From admission, onward)   Start     Dose/Rate Route Frequency Ordered Stop   06/17/18 1400  [MAR Hold]  ceFAZolin (ANCEF) IVPB 1 g/50 mL premix     (MAR Hold since Wed 06/17/2018 at 1127. Reason: Transfer to a Procedural area.)  Note to Pharmacy:  Send with pt to OR   1 g 100 mL/hr over 30 Minutes Intravenous To ShortStay Surgical 06/16/18 1805 06/17/18 1230   06/16/18 1112  ceFAZolin (ANCEF) 2-4 GM/100ML-%  IVPB    Note to Pharmacy:  Chad Mathis   : cabinet override      06/16/18 1112 06/16/18 2314   06/16/18 1112  ceFAZolin (ANCEF) powder       As needed 06/16/18 1125 06/16/18 1112   06/04/18 2200  piperacillin-tazobactam (ZOSYN) IVPB 2.25 g  Status:  Discontinued     2.25 g 100 mL/hr over 30 Minutes Intravenous Every 8 hours 06/04/18 1250 06/06/18 1846   06/04/18 1300  piperacillin-tazobactam (ZOSYN) IVPB 3.375 g     3.375 g 100 mL/hr over 30 Minutes Intravenous  Once 06/04/18 1250 06/04/18 1432   06/04/18 1100  cefTRIAXone (ROCEPHIN) 1 g in sodium chloride 0.9 % 100 mL IVPB     1 g 200 mL/hr over 30 Minutes Intravenous  Once 06/04/18 1054 06/04/18 1215       Objective: Vitals:   06/17/18 1445 06/17/18 1450 06/17/18 1456 06/17/18 1500  BP:  109/64    Pulse: 72 74 74 75  Resp: 13 13 13 12   Temp:  (!) 97.3 F (36.3 C)    TempSrc:      SpO2: 100% 100% 99% 100%  Weight:      Height:        Intake/Output Summary (Last 24 hours) at 06/17/2018 1646 Last data filed at 06/17/2018 1350 Gross per 24 hour  Intake 520 ml  Output 25 ml  Net 495 ml   [employer]  06/15/18 0651 06/15/18 1105 06/17/18 1146  Weight: 66.9 kg 62.5 kg 62.5 kg    Examination: General exam: Appears comfortable  HEENT: PERRLA, oral mucosa moist, no sclera icterus or thrush Respiratory system: Clear to auscultation. Respiratory effort normal. Cardiovascular system: S1 & S2 heard, RRR.   Gastrointestinal system: Abdomen soft, non-tender, nondistended. Normal bowel sounds. Central nervous system: Alert and oriented. No focal neurological deficits. Extremities: No cyanosis, clubbing or edema Skin: No rashes or ulcers Psychiatry:  Mood & affect appropriate.     Data Reviewed: I have personally reviewed following labs and imaging studies  CBC: Recent Labs  Lab 06/13/18 0411 06/15/18 0746 06/17/18 0243  WBC 5.9 7.8 9.4  HGB 8.1* 7.9* 8.0*  HCT 25.5* 25.1* 25.2*  MCV 78.5* 79.9* 79.0*  PLT  264 290 595   Basic Metabolic Panel: Recent Labs  Lab 06/14/18 0450 06/15/18 0408 06/15/18 0746 06/16/18 0227 06/17/18 0243  NA 138 138 138 135 136   136  K 3.4* 3.3* 3.3* 4.8 3.2*   3.2*  CL 103 105 104 101 101   100  CO2 21* 22 22 22 24   24   GLUCOSE 91 95 84 83 102*   102*  BUN 40* 44* 44* 20 24*   24*  CREATININE 9.08* 10.32* 10.14* 6.00* 6.93*   6.96*  CALCIUM 8.4* 8.1* 8.1* 7.8* 7.9*   7.9*  PHOS 5.5* 6.0* 6.2* 3.6 4.2   GFR: Estimated Creatinine Clearance: 8.7 mL/min (A) (by C-G formula based on SCr of 6.96 mg/dL (H)). Liver Function Tests: Recent Labs  Lab 06/14/18 0450 06/15/18 0408 06/15/18 0746 06/16/18 0227 06/17/18 0243  ALBUMIN 2.4* 2.2* 2.1* 2.3* 2.3*   No results for input(s): LIPASE, AMYLASE in the last 168 hours. No results for input(s): AMMONIA in the last 168 hours. Coagulation Profile: Recent Labs  Lab 06/15/18 1420 06/17/18 0243  INR 1.1 1.1   Cardiac Enzymes: Recent Labs  Lab 06/11/18 0453  CKTOTAL 5,411*   BNP (last 3 results) No results for input(s): PROBNP in the last 8760 hours. HbA1C: No results for input(s): HGBA1C in the last 72 hours. CBG: Recent Labs  Lab 06/16/18 1611 06/16/18 2108 06/17/18 0558 06/17/18 0829 06/17/18 1455  GLUCAP 117* 94 93 88 86   Lipid Profile: No results for input(s): Mathis, HDL, LDLCALC, TRIG, CHOLHDL, LDLDIRECT in the last 72 hours. Thyroid Function Tests: No results for input(s): TSH, T4TOTAL, FREET4, T3FREE, THYROIDAB in the last 72 hours. Anemia Panel: No results for input(s): VITAMINB12, FOLATE, FERRITIN, TIBC, IRON, RETICCTPCT in the last 72 hours. Urine analysis:    Component Value Date/Time   COLORURINE AMBER (A) 06/06/2018 1042   APPEARANCEUR HAZY (A) 06/06/2018 1042   LABSPEC 1.020 06/06/2018 1042   PHURINE 5.0 06/06/2018 1042   GLUCOSEU 50 (A) 06/06/2018 1042   HGBUR LARGE (A) 06/06/2018 1042   BILIRUBINUR NEGATIVE 06/06/2018 1042   KETONESUR NEGATIVE 06/06/2018 1042    PROTEINUR 100 (A) 06/06/2018 1042   NITRITE NEGATIVE 06/06/2018 1042   LEUKOCYTESUR NEGATIVE 06/06/2018 1042   Sepsis Labs: @LABRCNTIP (procalcitonin:4,lacticidven:4) )No results found for this or any previous visit (from the past 240 hour(s)).       Radiology Studies: Ir Fluoro Guide Cv Line Right  Result Date: 06/16/2018 INDICATION: ACUTE KIDNEY INJURY EXAM: ULTRASOUND GUIDANCE FOR VASCULAR ACCESS RIGHT INTERNAL JUGULAR PERMANENT HEMODIALYSIS CATHETER Date:  06/16/2018 06/16/2018 11:53 am Radiologist:  M. Daryll Brod, MD Guidance:  Ultrasound fluoroscopic FLUOROSCOPY TIME:  Fluoroscopy Time: 2 minutes 6 seconds (4 mGy).  MEDICATIONS: Ancef 2 g 1 the procedure ANESTHESIA/SEDATION: Versed 1.0 mg IV; Fentanyl 50 mcg IV; Moderate Sedation Time:  20 minutes The patient was continuously monitored during the procedure by the interventional radiology nurse under my direct supervision. CONTRAST:  None. COMPLICATIONS: None immediate. PROCEDURE: Informed consent was obtained from the patient following explanation of the procedure, risks, benefits and alternatives. The patient understands, agrees and consents for the procedure. All questions were addressed. A time out was performed. Maximal barrier sterile technique utilized including caps, mask, sterile gowns, sterile gloves, large sterile drape, hand hygiene, and 2% chlorhexidine scrub. Under sterile conditions and local anesthesia, right internal jugular micropuncture venous access was performed with ultrasound. Images were obtained for documentation of the patent right internal jugular vein. A guide wire was inserted followed by a transitional dilator. Next, a 0.035 guidewire was advanced into the IVC with a 5-French catheter. Measurements were obtained from the right venotomy site to the proximal right atrium. In the infraclavicular chest, a subcutaneous tunnel was created under sterile conditions and local anesthesia. 1% lidocaine with epinephrine was utilized  for this. The 19 cm tip to cuff palindrome catheter was tunneled subcutaneously to the venotomy site and inserted into the SVC/RA junction through a valved peel-away sheath. Position was confirmed with fluoroscopy. Images were obtained for documentation. Blood was aspirated from the catheter followed by saline and heparin flushes. The appropriate volume and strength of heparin was instilled in each lumen. Caps were applied. The catheter was secured at the tunnel site with Gelfoam and a pursestring suture. The venotomy site was closed with subcuticular Vicryl suture. Dermabond was applied to the small right neck incision. A dry sterile dressing was applied. The catheter is ready for use. No immediate complications. IMPRESSION: Ultrasound and fluoroscopically guided right internal jugular tunneled hemodialysis catheter (19 cm tip cuff palindrome catheter). Electronically Signed   By: Jerilynn Mages.  Shick M.D.   On: 06/16/2018 12:28   Ir US Guide Vasc Access Right  Result Date: 06/16/2018 INDICATION: ACUTE KIDNEY INJURY EXAM: ULTRASOUND GUIDANCE FOR VASCULAR ACCESS RIGHT INTERNAL JUGULAR PERMANENT HEMODIALYSIS CATHETER Date:  06/16/2018 06/16/2018 11:53 am Radiologist:  Jerilynn Mages. Daryll Brod, MD Guidance:  Ultrasound fluoroscopic FLUOROSCOPY TIME:  Fluoroscopy Time: 2 minutes 6 seconds (4 mGy). MEDICATIONS: Ancef 2 g 1 the procedure ANESTHESIA/SEDATION: Versed 1.0 mg IV; Fentanyl 50 mcg IV; Moderate Sedation Time:  20 minutes The patient was continuously monitored during the procedure by the interventional radiology nurse under my direct supervision. CONTRAST:  None. COMPLICATIONS: None immediate. PROCEDURE: Informed consent was obtained from the patient following explanation of the procedure, risks, benefits and alternatives. The patient understands, agrees and consents for the procedure. All questions were addressed. A time out was performed. Maximal barrier sterile technique utilized including caps, mask, sterile gowns, sterile  gloves, large sterile drape, hand hygiene, and 2% chlorhexidine scrub. Under sterile conditions and local anesthesia, right internal jugular micropuncture venous access was performed with ultrasound. Images were obtained for documentation of the patent right internal jugular vein. A guide wire was inserted followed by a transitional dilator. Next, a 0.035 guidewire was advanced into the IVC with a 5-French catheter. Measurements were obtained from the right venotomy site to the proximal right atrium. In the infraclavicular chest, a subcutaneous tunnel was created under sterile conditions and local anesthesia. 1% lidocaine with epinephrine was utilized for this. The 19 cm tip to cuff palindrome catheter was tunneled subcutaneously to the venotomy site and inserted into the SVC/RA junction through a valved peel-away sheath.  Position was confirmed with fluoroscopy. Images were obtained for documentation. Blood was aspirated from the catheter followed by saline and heparin flushes. The appropriate volume and strength of heparin was instilled in each lumen. Caps were applied. The catheter was secured at the tunnel site with Gelfoam and a pursestring suture. The venotomy site was closed with subcuticular Vicryl suture. Dermabond was applied to the small right neck incision. A dry sterile dressing was applied. The catheter is ready for use. No immediate complications. IMPRESSION: Ultrasound and fluoroscopically guided right internal jugular tunneled hemodialysis catheter (19 cm tip cuff palindrome catheter). Electronically Signed   By: Jerilynn Mages.  Shick M.D.   On: 06/16/2018 12:28   Vas [place] Upper Ext Vein Mapping (pre-op Avf)  Result Date: 06/17/2018 UPPER EXTREMITY VEIN MAPPING  Indications: Pre-access. Performing Technologist: June Leap RDMS, RVT  Examination Guidelines: A complete evaluation includes B-mode imaging, spectral Doppler, color Doppler, and power Doppler as needed of all accessible portions of each vessel.  Bilateral testing is considered an integral part of a complete examination. Limited examinations for reoccurring indications may be performed as noted. +-----------------+-------------+----------+--------------+  Right Cephalic    Diameter (cm) Depth (cm)    Findings     +-----------------+-------------+----------+--------------+  Shoulder              0.22         0.74                    +-----------------+-------------+----------+--------------+  Prox upper arm                             not visualized  +-----------------+-------------+----------+--------------+  Mid upper arm                              not visualized  +-----------------+-------------+----------+--------------+  Dist upper arm                             not visualized  +-----------------+-------------+----------+--------------+  Antecubital fossa                          not visualized  +-----------------+-------------+----------+--------------+  Prox forearm          0.17                                 +-----------------+-------------+----------+--------------+ +-----------------+-------------+----------+--------+  Right Basilic     Diameter (cm) Depth (cm) Findings  +-----------------+-------------+----------+--------+  Mid upper arm         0.32         1.79              +-----------------+-------------+----------+--------+  Dist upper arm        0.24         1.38              +-----------------+-------------+----------+--------+  Antecubital fossa     0.24         1.00              +-----------------+-------------+----------+--------+ +-----------------+-------------+----------+-------------------------+  Left Cephalic     Diameter (cm) Depth (cm)         Findings           +-----------------+-------------+----------+-------------------------+  Shoulder  0.21         1.12                               +-----------------+-------------+----------+-------------------------+  Mid upper arm         0.18         0.45           thick  walls         +-----------------+-------------+----------+-------------------------+  Dist upper arm        0.45                         thrombus           +-----------------+-------------+----------+-------------------------+  Antecubital fossa     0.18         0.26    branching and thick walls  +-----------------+-------------+----------+-------------------------+  Prox forearm          0.17                                            +-----------------+-------------+----------+-------------------------+  Mid forearm           0.10                                            +-----------------+-------------+----------+-------------------------+ +-----------------+-------------+----------+--------+  Left Basilic      Diameter (cm) Depth (cm) Findings  +-----------------+-------------+----------+--------+  Mid upper arm         0.21         1.36              +-----------------+-------------+----------+--------+  Dist upper arm        0.30         1.69              +-----------------+-------------+----------+--------+  Antecubital fossa     0.29         1.27              +-----------------+-------------+----------+--------+ *See table(s) above for measurements and observations.  Diagnosing physician:    Preliminary       Scheduled Meds:  [MAR Hold] aspirin  81 mg Oral Daily   chlorhexidine  60 mL Topical Once   [MAR Hold] Chlorhexidine Gluconate Cloth  6 each Topical Q0600   [MAR Hold] darbepoetin (ARANESP) injection - NON-DIALYSIS  100 mcg Subcutaneous Q Sat-1800   [MAR Hold] divalproex  125 mg Oral QHS   [MAR Hold] ferrous sulfate  325 mg Oral Q breakfast   [MAR Hold] fluticasone  1 spray Each Nare Daily   [MAR Hold] furosemide  80 mg Intravenous Q12H   [MAR Hold] insulin aspart  0-9 Units Subcutaneous Q4H   [MAR Hold] traZODone  150 mg Oral QHS   [MAR Hold] ziprasidone  60 mg Oral Daily   Continuous Infusions:  sodium chloride 10 mL/hr at 06/17/18 1156   [MAR Hold] sodium chloride         LOS: 13 days      Debbe Odea, MD [facility] Pager: [email or web address] Password [facility] 06/17/2018, 4:46 PM

## 2018-06-17 NOTE — Progress Notes (Signed)
UE vein mapping       has been completed. Preliminary results can be found under CV proc through chart review. Yaret Hush, BS, RDMS, RVT   

## 2018-06-17 NOTE — Anesthesia Procedure Notes (Signed)
Procedure Name: LMA Insertion Date/Time: 06/17/2018 12:11 PM Performed by: Kyung Rudd, CRNA Pre-anesthesia Checklist: Patient identified, Emergency Drugs available, Suction available and Patient being monitored Patient Re-evaluated:Patient Re-evaluated prior to induction Oxygen Delivery Method: Circle system utilized Preoxygenation: Pre-oxygenation with 100% oxygen Induction Type: IV induction LMA: LMA inserted LMA Size: 4.0 Number of attempts: 1 Placement Confirmation: positive ETCO2 and breath sounds checked- equal and bilateral Tube secured with: Tape Dental Injury: Teeth and Oropharynx as per pre-operative assessment

## 2018-06-17 NOTE — Interval H&P Note (Signed)
History and Physical Interval Note:  06/17/2018 11:45 AM  Chad Mathis  has presented today for surgery, with the diagnosis of ACUTE RENAL FAILURE.  The various methods of treatment have been discussed with the patient and family. After consideration of risks, benefits and other options for treatment, the patient has consented to  Procedure(s): ARTERIOVENOUS (AV) FISTULA CREATION VERSES ARTERIOVENOUS GRAFT (Left) as a surgical intervention.  The patient's history has been reviewed, patient examined, no change in status, stable for surgery.  I have reviewed the patient's chart and labs.  Questions were answered to the patient's satisfaction.     [name]

## 2018-06-17 NOTE — Transfer of Care (Signed)
Immediate Anesthesia Transfer of Care Note  Patient: Chad Mathis  Procedure(s) Performed: ARTERIOVENOUS (AV) FISTULA CREATION LEFT ARM (Left Arm Upper)  Patient Location: PACU  Anesthesia Type:General  Level of Consciousness: awake, alert  and oriented  Airway & Oxygen Therapy: Patient Spontanous Breathing and Patient connected to nasal cannula oxygen  Post-op Assessment: Report given to RN, Post -op Vital signs reviewed and stable and Patient moving all extremities X 4  Post vital signs: Reviewed and stable  Last Vitals:  Vitals Value Taken Time  BP 114/61 06/17/2018  1:51 PM  Temp    Pulse 72 06/17/2018  1:54 PM  Resp 16 06/17/2018  1:54 PM  SpO2 100 % 06/17/2018  1:54 PM  Vitals shown include unvalidated device data.  Last Pain:  Vitals:   06/17/18 0901  TempSrc:   PainSc: 0-No pain         Complications: No apparent anesthesia complications

## 2018-06-17 NOTE — Progress Notes (Signed)
[facility] Progress Note    Assessment/ Plan:   AKI/CKD stage 3(last known Cr was 1.42 in Dec 2017). Oliguric presumably multifactorial; volume depletion due to N/V in setting of concomitant ACE inhibition and diureticswith superimposed rhabdomyolysis. s/p HD on 06/04/18 due to hyperkalemia. [place] without hydro but did show increased echogenicity consistent with chronic medical renal disease. Started HD 6/4 as felt badly and numbers do continue to worsen off HD vascath placed 5/28 -> RIJ temp changed to RIJ TC by VIR 6/9  1. May restart ACEI if needed 2. CK finally trending down  3. remains oliguric  Last HD Monday 6/8 -> plan on HD later today or tomorrow AM depending on when sx is.  Bladder scan 6/8 only 119ml.  No signs of recovery and w/ h/o CKD IIIB in 2017 likely represents progression with poor reserve + rhabdo  ->appreciate VIR  converting to [facility] and VVS to place permanent shunt shunt + CLIP.    2. Metabolic acidosis- better. Will stop nahco3 as well. 3. Rhabdomyolysis- likely contributing to AKI but unclear etiology other than statin therapy. No history of falls or being found down.  follow levels, trending down 4. Anemia- presumably due to CKD, however unknown baseline Cr.  SPEP/UPEP neg, iron stores OK and follow. Started ESA 5. 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 SIRS- completed zosyn - covid-19 negative  6. DM- per primary svc 7. HTN- low BP's on arrival, meds on hold  Subjective:   Denies f/c/n/v/dyspnea/cp   Objective:   BP 106/63 (BP Location: Left Arm)   Pulse 68   Temp 98.5 F (36.9 C) (Oral)   Resp 18   Ht 5\' 3"  (1.6 m)   Wt 62.5 kg   SpO2 97%   BMI 24.41 kg/m   Intake/Output Summary (Last 24 hours) at 06/17/2018 0725 Last data filed at 06/16/2018 1700 Gross per 24 hour  Intake 120 ml  Output -  Net 120 ml   Weight change:   Physical Exam: Gen:NAD CVS: no rub  Resp: cta Abd: benign Ext: pitting edema Access: RIJ  TC  Imaging: Ir Fluoro Guide Cv Line Right  Result Date: 06/16/2018 INDICATION: ACUTE KIDNEY INJURY EXAM: ULTRASOUND GUIDANCE FOR VASCULAR ACCESS RIGHT INTERNAL JUGULAR PERMANENT HEMODIALYSIS CATHETER Date:  06/16/2018 06/16/2018 11:53 am Radiologist:  Jerilynn Mages. Daryll Brod, MD Guidance:  Ultrasound fluoroscopic FLUOROSCOPY TIME:  Fluoroscopy Time: 2 minutes 6 seconds (4 mGy). MEDICATIONS: Ancef 2 g 1 the procedure ANESTHESIA/SEDATION: Versed 1.0 mg IV; Fentanyl 50 mcg IV; Moderate Sedation Time:  20 minutes The patient was continuously monitored during the procedure by the interventional radiology nurse under my direct supervision. CONTRAST:  None. COMPLICATIONS: None immediate. PROCEDURE: Informed consent was obtained from the patient following explanation of the procedure, risks, benefits and alternatives. The patient understands, agrees and consents for the procedure. All questions were addressed. A time out was performed. Maximal barrier sterile technique utilized including caps, mask, sterile gowns, sterile gloves, large sterile drape, hand hygiene, and 2% chlorhexidine scrub. Under sterile conditions and local anesthesia, right internal jugular micropuncture venous access was performed with ultrasound. Images were obtained for documentation of the patent right internal jugular vein. A guide wire was inserted followed by a transitional dilator. Next, a 0.035 guidewire was advanced into the IVC with a 5-French catheter. Measurements were obtained from the right venotomy site to the proximal right atrium. In the infraclavicular chest, a subcutaneous tunnel was created under sterile conditions and local anesthesia. 1% lidocaine with epinephrine was  utilized for this. The 19 cm tip to cuff palindrome catheter was tunneled subcutaneously to the venotomy site and inserted into the SVC/RA junction through a valved peel-away sheath. Position was confirmed with fluoroscopy. Images were obtained for documentation. Blood was  aspirated from the catheter followed by saline and heparin flushes. The appropriate volume and strength of heparin was instilled in each lumen. Caps were applied. The catheter was secured at the tunnel site with Gelfoam and a pursestring suture. The venotomy site was closed with subcuticular Vicryl suture. Dermabond was applied to the small right neck incision. A dry sterile dressing was applied. The catheter is ready for use. No immediate complications. IMPRESSION: Ultrasound and fluoroscopically guided right internal jugular tunneled hemodialysis catheter (19 cm tip cuff palindrome catheter). Electronically Signed   By: Jerilynn Mages.  Shick M.D.   On: 06/16/2018 12:28   Ir US Guide Vasc Access Right  Result Date: 06/16/2018 INDICATION: ACUTE KIDNEY INJURY EXAM: ULTRASOUND GUIDANCE FOR VASCULAR ACCESS RIGHT INTERNAL JUGULAR PERMANENT HEMODIALYSIS CATHETER Date:  06/16/2018 06/16/2018 11:53 am Radiologist:  Jerilynn Mages. Daryll Brod, MD Guidance:  Ultrasound fluoroscopic FLUOROSCOPY TIME:  Fluoroscopy Time: 2 minutes 6 seconds (4 mGy). MEDICATIONS: Ancef 2 g 1 the procedure ANESTHESIA/SEDATION: Versed 1.0 mg IV; Fentanyl 50 mcg IV; Moderate Sedation Time:  20 minutes The patient was continuously monitored during the procedure by the interventional radiology nurse under my direct supervision. CONTRAST:  None. COMPLICATIONS: None immediate. PROCEDURE: Informed consent was obtained from the patient following explanation of the procedure, risks, benefits and alternatives. The patient understands, agrees and consents for the procedure. All questions were addressed. A time out was performed. Maximal barrier sterile technique utilized including caps, mask, sterile gowns, sterile gloves, large sterile drape, hand hygiene, and 2% chlorhexidine scrub. Under sterile conditions and local anesthesia, right internal jugular micropuncture venous access was performed with ultrasound. Images were obtained for documentation of the patent right internal  jugular vein. A guide wire was inserted followed by a transitional dilator. Next, a 0.035 guidewire was advanced into the IVC with a 5-French catheter. Measurements were obtained from the right venotomy site to the proximal right atrium. In the infraclavicular chest, a subcutaneous tunnel was created under sterile conditions and local anesthesia. 1% lidocaine with epinephrine was utilized for this. The 19 cm tip to cuff palindrome catheter was tunneled subcutaneously to the venotomy site and inserted into the SVC/RA junction through a valved peel-away sheath. Position was confirmed with fluoroscopy. Images were obtained for documentation. Blood was aspirated from the catheter followed by saline and heparin flushes. The appropriate volume and strength of heparin was instilled in each lumen. Caps were applied. The catheter was secured at the tunnel site with Gelfoam and a pursestring suture. The venotomy site was closed with subcuticular Vicryl suture. Dermabond was applied to the small right neck incision. A dry sterile dressing was applied. The catheter is ready for use. No immediate complications. IMPRESSION: Ultrasound and fluoroscopically guided right internal jugular tunneled hemodialysis catheter (19 cm tip cuff palindrome catheter). Electronically Signed   By: Jerilynn Mages.  Shick M.D.   On: 06/16/2018 12:28    Labs: BMET Recent Labs  Lab 06/12/18 0317 06/13/18 0411 06/14/18 0450 06/15/18 0408 06/15/18 0746 06/16/18 0227 06/17/18 0243  NA 138 138 138 138 138 135 136  136  K 3.4* 3.6 3.4* 3.3* 3.3* 4.8 3.2*  3.2*  CL 104 104 103 105 104 101 101  100  CO2 23 22 21* 22 22 22 24  24   GLUCOSE 88  88 91 95 84 83 102*  102*  BUN 26* 32* 40* 44* 44* 20 24*  24*  CREATININE 6.94* 8.09* 9.08* 10.32* 10.14* 6.00* 6.93*  6.96*  CALCIUM 7.9* 8.2* 8.4* 8.1* 8.1* 7.8* 7.9*  7.9*  PHOS 4.1 4.7* 5.5* 6.0* 6.2* 3.6 4.2   CBC Recent Labs  Lab 06/13/18 0411 06/15/18 0746 06/17/18 0243  WBC 5.9 7.8 9.4   HGB 8.1* 7.9* 8.0*  HCT 25.5* 25.1* 25.2*  MCV 78.5* 79.9* 79.0*  PLT 264 290 232    Medications:    . aspirin  81 mg Oral Daily  . chlorhexidine  60 mL Topical Once  . Chlorhexidine Gluconate Cloth  6 each Topical Q0600  . darbepoetin (ARANESP) injection - NON-DIALYSIS  100 mcg Subcutaneous Q Sat-1800  . divalproex  125 mg Oral QHS  . ferrous sulfate  325 mg Oral Q breakfast  . fluticasone  1 spray Each Nare Daily  . furosemide  80 mg Intravenous Q12H  . insulin aspart  0-9 Units Subcutaneous Q4H  . traZODone  150 mg Oral QHS  . ziprasidone  60 mg Oral Daily      Otelia Santee, MD 06/17/2018, 7:25 AM

## 2018-06-17 NOTE — Progress Notes (Signed)
Patient POA and legal guardian Chad Mathis would like the nephrology doctor to call her ASAP in the morning.

## 2018-06-17 NOTE — Discharge Instructions (Signed)
° °  [facility] ° °Discharge Instructions ° °AV Fistula or Graft Surgery for Dialysis Access ° °Please refer to the following instructions for your post-procedure care. Your surgeon or physician assistant will discuss any changes with you. ° °Activity ° °You may drive the day following your surgery, if you are comfortable and no longer taking prescription pain medication. Resume full activity as the soreness in your incision resolves. ° °Bathing/Showering ° °You may shower after you go home. Keep your incision dry for 48 hours. Do not soak in a bathtub, hot tub, or swim until the incision heals completely. You may not shower if you have a hemodialysis catheter. ° °Incision Care ° °Clean your incision with mild soap and water after 48 hours. Pat the area dry with a clean towel. You do not need a bandage unless otherwise instructed. Do not apply any ointments or creams to your incision. You may have skin glue on your incision. Do not peel it off. It will come off on its own in about one week. Your arm may swell a bit after surgery. To reduce swelling use pillows to elevate your arm so it is above your heart. Your doctor will tell you if you need to lightly wrap your arm with an ACE bandage. ° °Diet ° °Resume your normal diet. There are not special food restrictions following this procedure. In order to heal from your surgery, it is CRITICAL to get adequate nutrition. Your body requires vitamins, minerals, and protein. Vegetables are the best source of vitamins and minerals. Vegetables also provide the perfect balance of protein. Processed food has little nutritional value, so try to avoid this. ° °Medications ° °Resume taking all of your medications. If your incision is causing pain, you may take over-the counter pain relievers such as acetaminophen (Tylenol). If you were prescribed a stronger pain medication, please be aware these medications can cause nausea and constipation. Prevent  nausea by taking the medication with a snack or meal. Avoid constipation by drinking plenty of fluids and eating foods with high amount of fiber, such as fruits, vegetables, and grains. Do not take Tylenol if you are taking prescription pain medications. ° ° ° ° °Follow up °Your surgeon may want to see you in the office following your access surgery. If so, this will be arranged at the time of your surgery. ° °Please call us immediately for any of the following conditions: ° °Increased pain, redness, drainage (pus) from your incision site °Fever of 101 degrees or higher °Severe or worsening pain at your incision site °Hand pain or numbness. ° °Reduce your risk of vascular disease: ° °Stop smoking. If you would like help, call [facility] at 1-800-QUIT-NOW (1-800-784-8669) or [place] at 336-586-4000 ° °Manage your cholesterol °Maintain a desired weight °Control your diabetes °Keep your blood pressure down ° °Dialysis ° °It will take several weeks to several months for your new dialysis access to be ready for use. Your surgeon will determine when it is OK to use it. Your nephrologist will continue to direct your dialysis. You can continue to use your Permcath until your new access is ready for use. ° °If you have any questions, please call the office at [phone]. ° °

## 2018-06-17 NOTE — Anesthesia Preprocedure Evaluation (Addendum)
Anesthesia Evaluation  Patient identified by MRN, date of birth, ID band Patient awake    Reviewed: Allergy & Precautions, NPO status , Patient's Chart, lab work & pertinent test results  Airway Mallampati: II  TM Distance: >3 FB Neck ROM: Full    Dental no notable dental hx. (+) Teeth Intact   Pulmonary asthma ,    Pulmonary exam normal breath sounds clear to auscultation       Cardiovascular negative cardio ROS Normal cardiovascular exam Rhythm:Regular Rate:Normal  EKG 06/04/2018- Sinus Tachycardia   Neuro/Psych PSYCHIATRIC DISORDERS Bipolar Disorder negative neurological ROS     GI/Hepatic negative GI ROS, Neg liver ROS,   Endo/Other  diabetes, Well Controlled, Type 2, Oral Hypoglycemic Agents  Renal/GU ARFRenal diseaseLast dialysis yesterday  negative genitourinary   Musculoskeletal negative musculoskeletal ROS (+)   Abdominal   Peds  Hematology  (+) anemia , SIRS   Anesthesia Other Findings   Reproductive/Obstetrics                            Anesthesia Physical Anesthesia Plan  ASA: IV  Anesthesia Plan: General   Post-op Pain Management:    Induction:   PONV Risk Score and Plan: 2 and Ondansetron, Midazolam and Treatment may vary due to age or medical condition  Airway Management Planned: LMA  Additional Equipment:   Intra-op Plan:   Post-operative Plan: Extubation in OR  Informed Consent: I have reviewed the patients History and Physical, chart, labs and discussed the procedure including the risks, benefits and alternatives for the proposed anesthesia with the patient or authorized representative who has indicated his/her understanding and acceptance.     Dental advisory given  Plan Discussed with: CRNA  Anesthesia Plan Comments:        Anesthesia Quick Evaluation

## 2018-06-17 NOTE — Anesthesia Postprocedure Evaluation (Signed)
Anesthesia Post Note  Patient: Chad Mathis  Procedure(s) Performed: ARTERIOVENOUS (AV) FISTULA CREATION LEFT ARM (Left Arm Upper)     Patient location during evaluation: PACU Anesthesia Type: General Level of consciousness: awake and alert and oriented Pain management: pain level controlled Vital Signs Assessment: post-procedure vital signs reviewed and stable Respiratory status: spontaneous breathing, nonlabored ventilation and respiratory function stable Cardiovascular status: blood pressure returned to baseline and stable Postop Assessment: no apparent nausea or vomiting Anesthetic complications: no    Last Vitals:  Vitals:   06/17/18 0555 06/17/18 1351  BP: 106/63   Pulse: 68   Resp:    Temp: 36.9 C (!) 36.2 C  SpO2: 97%     Last Pain:  Vitals:   06/17/18 1351  TempSrc:   PainSc: Asleep                 Lateria Alderman A.

## 2018-06-18 ENCOUNTER — Encounter (HOSPITAL_COMMUNITY): Payer: Self-pay | Admitting: Surgery

## 2018-06-18 LAB — COMPREHENSIVE METABOLIC PANEL
ALT: 25 U/L (ref 0–44)
AST: 18 U/L (ref 15–41)
Albumin: 2.2 g/dL — ABNORMAL LOW (ref 3.5–5.0)
Alkaline Phosphatase: 38 U/L (ref 38–126)
Anion gap: 12 (ref 5–15)
BUN: 29 mg/dL — ABNORMAL HIGH (ref 8–23)
CO2: 22 mmol/L (ref 22–32)
Calcium: 7.8 mg/dL — ABNORMAL LOW (ref 8.9–10.3)
Chloride: 101 mmol/L (ref 98–111)
Creatinine, Ser: 8.36 mg/dL — ABNORMAL HIGH (ref 0.61–1.24)
GFR calc Af Amer: 7 mL/min — ABNORMAL LOW (ref >60)
GFR calc non Af Amer: 6 mL/min — ABNORMAL LOW (ref >60)
Glucose, Bld: 85 mg/dL (ref 70–99)
Potassium: 3.5 mmol/L (ref 3.5–5.1)
Sodium: 135 mmol/L (ref 135–145)
Total Bilirubin: 0.5 mg/dL (ref 0.3–1.2)
Total Protein: 4.5 g/dL — ABNORMAL LOW (ref 6.5–8.1)

## 2018-06-18 LAB — GLUCOSE, CAPILLARY
Glucose-Capillary: 80 mg/dL (ref 70–99)
Glucose-Capillary: 76 mg/dL (ref 70–99)
Glucose-Capillary: 114 mg/dL — ABNORMAL HIGH (ref 70–99)
Glucose-Capillary: 137 mg/dL — ABNORMAL HIGH (ref 70–99)
Glucose-Capillary: 133 mg/dL — ABNORMAL HIGH (ref 70–99)

## 2018-06-18 LAB — PHOSPHORUS: Phosphorus: 6.1 mg/dL — ABNORMAL HIGH (ref 2.5–4.6)

## 2018-06-18 NOTE — TOC Initial Note (Deleted)
Transition of Care [facility]) - Initial/Assessment Note    Patient Details  Name: Chad Mathis MRN: [id number] Date of Birth: January 17, 1955  [facility] CM/SW Contact:    Sable Feil, LCSW Phone Number: 06/18/2018, 5:33 PM  Clinical Narrative:  Talked with patient's legal guardian Chad Mathis, patient's  cousin [phone]) regarding patient's discharge disposition.  Chad Mathis reported that she wants permanent placement for  "Chad Mathis", as she calls him. She reported that patient is her cousin, and other than her sister, he is her only living family. Chad Mathis reported that Chad Mathis has lived at [facility] for 8 years. The director is Mr. Rosana Hoes [phone] cell). Chad Mathis feels that due to his health issues, around the clock medical care is needed. She added that his eyesight is getting worse. 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 She added that Chad Mathis is verbal, very intelligent (completed 2 years of college) and is not violent. Her facility preferences are (1)[facility] and (2) [facility] as she has had family at these facilities. Chad Mathis is in agreement with ST rehab and wants him to transition to LTC.              Expected Discharge Plan: Skilled Nursing Facility Barriers to Discharge: Insurance Authorization, No SNF bed(Facility search initiated 06/18/18)   Patient Goals and CMS Choice Patient states their goals for this hospitalization and ongoing recovery are:: Patient's guardian wants him placed in a skilled nursing facility for ST rehab and transition to LTC due to his health issues. CMS Medicare.gov Compare Post Acute Care list provided to:: Other (Comment Required)(Guardian provided CSW with facility preferences) Choice offered to / list presented to : NA(Guardian provided CSW with SNF preferences)  Expected Discharge Plan and Services Expected Discharge  Plan: [place] In-house Referral: Clinical Social Work Discharge Planning Services: Other - See comment(SNF placement) Post Acute Care Choice: [place] arrangements for the past 2 months: [place] Expected Discharge Date: (unknown)               DME Arranged: N/A DME Agency: NA       HH Arranged: NA HH Agency: NA        Prior Living Arrangements/Services Living arrangements for the past 2 months: [place] Lives with:: Facility Resident Patient language and need for interpreter reviewed:: No Do you feel safe going back to the place where you live?: Yes      Need for Family Participation in Patient Care: Yes (Comment)(Guardian involved) Care giver support system in place?: Yes (comment)   Criminal Activity/Legal Involvement Pertinent to Current Situation/Hospitalization: No - Comment as needed  Activities of Daily Living Home Assistive Devices/Equipment: Eyeglasses, CBG Meter ADL Screening (condition at time of admission) Patient's cognitive ability adequate to safely complete daily activities?: Yes Is the patient deaf or have difficulty hearing?: No Does the patient have difficulty seeing, even when wearing glasses/contacts?: Yes(patient legally blind) Does the patient have difficulty concentrating, remembering, or making decisions?: No Patient able to express need for assistance with ADLs?: Yes Does the patient have difficulty dressing or bathing?: Yes Independently performs ADLs?: No Communication: Independent Dressing (OT): Needs assistance Is this a change from baseline?: Pre-admission baseline Grooming: Needs assistance Is this a change from baseline?: Pre-admission baseline Feeding: Needs assistance Is this a change from baseline?: Pre-admission baseline Toileting: Independent In/Out Bed: Independent Walks in Home: Independent Does  the patient have difficulty walking or climbing stairs?:  No Weakness of Legs: Both Weakness of Arms/Hands: None  Permission Sought/Granted Permission sought to share information with : Guardian Permission granted to share information with : No(Chad Mathis is patient's legal guardian)           Permission granted to share info w Contact Information: [place]  Emotional Assessment Appearance:: Other (Comment Required(Did not visit with patient) Attitude/Demeanor/Rapport: Other (comment)(Did not visit with patient) Affect (typically observed): Other (comment)(Did not visit with patient) Orientation: : Oriented to Self, Oriented to Place, Oriented to  Time, Oriented to Situation Alcohol / Substance Use: Never Used Psych Involvement: No (comment)  Admission diagnosis:  Acute hyperkalemia [E87.5] Lactic acidosis [E87.2] SIRS (systemic inflammatory response syndrome) (HCC) [R65.10] Acute renal failure, unspecified acute renal failure type [facility]) [N17.9] Patient Active Problem List   Diagnosis Date Noted  . Acute renal failure ([place]) 06/04/2018  . SIRS (systemic inflammatory response syndrome) ([place]) 06/04/2018  . Onychomycosis 02/26/2013  . Pain in lower limb 02/26/2013  . Diabetes mellitus ([place]) 02/26/2013   PCP:  Verner Chol, MD Pharmacy:  No Pharmacies Listed    Social Determinants of Health (SDOH) Interventions  No SDOH interventions needed at this time.  Readmission Risk Interventions No flowsheet data found.

## 2018-06-18 NOTE — TOC Progression Note (Addendum)
Transition of Care [facility]) - Progression Note    Patient Details  Name: Chad Mathis MRN: [id number] Date of Birth: [birth year]  [facility]) CM/SW Contact  Chad Salina Mila Homer, LCSW Phone Number: 06/18/2018, 5:55 PM  Clinical Narrative:    Talked with patient's legal guardian Chad Mathis, patient's  cousin [phone]) regarding patient's discharge to SNF. Chad Mathis shared with CSW regarding her cousin. Chad Mathis reported that they are working on permanent placement for  "Chad Mathis", as she calls him. She reported that patient is her cousin, and other than her sister, he is her only living family. Chad Mathis reported that Chad Mathis has lived at [facility] for 8 years. The director is Chad Mathis [phone] cell). Chad Mathis reported that she wants SNF for Chad Mathis due to his health issues. 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 She added that Chad Mathis is verbal, very intelligent (completed 2 years of college) and is not violent. She expressed awareness that patient is supposed to discharge to [facility], her 1st choice.    CSW talked with Chad Mathis, renal navigator today and was advised of patient's HD schedule: MWF - [place]. Chair time 12:05 pm. Arrive 11:30 am for first treatment to complete paperwork.             Expected Discharge Plan: Skilled Nursing Facility Barriers to discharge: Insurance authorization. HD set-up at dialysis center - completed 6/11.  Expected Discharge Plan and Services Expected Discharge Plan: [place] In-house Referral: Clinical Social Work Discharge Planning Services: Other - See comment(SNF placement) Post Acute Care Choice: [place] arrangements for the past 2 months: [place] Expected Discharge Date: (unknown)               DME Arranged: N/A DME Agency: NA       HH Arranged:  NA HH Agency: NA         Social Determinants of Health (SDOH) Interventions  No SDOH interventions needed at this time.  Readmission Risk Interventions No flowsheet data found.

## 2018-06-18 NOTE — Progress Notes (Signed)
PROGRESS NOTE    Chad Mathis   ONG:[id number]  DOB: 16-Jul-1955  DOA: 06/04/2018 PCP: Verner Chol, MD   Brief Narrative:  [name] 63 y.o.malewith medical history significant ofdiabetes, bipolar disorder, legally blind.Patient reports nausea, vomiting and weakness for the last 3 days. He continued to take his home meds including HCTZ, Benazepril and Metformin.   CBG was 61.  Cr 8.86, K was 7.0, Lactic acid was 7.5 Was Tachypneic and tachycardic.  WBC  Was 13.5.   Subjective: He has no complaints today.     Assessment & Plan:   Principal Problem:   Acute renal failure on CKD 3 - Cr was 1.42 in 2017 and then 8.86 when admitted - started on HD on 5/28 - renal ultrasound consistent with chronic medical renal disease - received tunneled cath on 6/9  - left basilic fistula created 4/40 -  CLIP completed- awaiting PT to see patient for disposition  Active Problems: Rhabdomyolysis - CK 12,563 on 5/29- improved to 5,411 by 6/4 - cause remains uncertain  Elevated LFTs - ? Due to rhabdomyolysis- has resolved    Diabetes mellitus 2 - holding Glucophage - A1c 5.9 on 06/05/18  Bipolar disorder - cont Depakote and Geodon  Essential HTN - currently hypotensive   Anemia of chronic disease - Hb ~ 8 and stable   Time spent in minutes:  30 min - discussed plan with renal, RN and SW DVT prophylaxis:  Heparin  Code Status: Full code Family Communication:   Disposition Plan: needs PT re-eval as last Eval was 6/11 Consultants:    Nephro  Vascular surgery Procedures:   Temp and tunneled Dialysis cath  fistula Antimicrobials:  Anti-infectives (From admission, onward)   Start     Dose/Rate Route Frequency Ordered Stop   06/17/18 1400  ceFAZolin (ANCEF) IVPB 1 g/50 mL premix    Note to Pharmacy: Send with pt to OR   1 g 100 mL/hr over 30 Minutes Intravenous To [facility] 06/16/18 1805 06/17/18 1230   06/16/18 1112  ceFAZolin (ANCEF) 2-4  GM/100ML-% IVPB    Note to Pharmacy: Arlean Hopping   : cabinet override      06/16/18 1112 06/16/18 2314   06/16/18 1112  ceFAZolin (ANCEF) powder       As needed 06/16/18 1125 06/16/18 1112   06/04/18 2200  piperacillin-tazobactam (ZOSYN) IVPB 2.25 g  Status:  Discontinued     2.25 g 100 mL/hr over 30 Minutes Intravenous Every 8 hours 06/04/18 1250 06/06/18 1846   06/04/18 1300  piperacillin-tazobactam (ZOSYN) IVPB 3.375 g     3.375 g 100 mL/hr over 30 Minutes Intravenous  Once 06/04/18 1250 06/04/18 1432   06/04/18 1100  cefTRIAXone (ROCEPHIN) 1 g in sodium chloride 0.9 % 100 mL IVPB     1 g 200 mL/hr over 30 Minutes Intravenous  Once 06/04/18 1054 06/04/18 1215       Objective: Vitals:   06/18/18 0529 06/18/18 0531 06/18/18 0702 06/18/18 1229  BP: (!) 92/58 (!) 93/57  107/65  Pulse: 75 73  75  Resp: 15   18  Temp: 98.8 F (37.1 C)   98.8 F (37.1 C)  TempSrc: Oral     SpO2: 97%   100%  Weight:   65.3 kg   Height:        Intake/Output Summary (Last 24 hours) at 06/18/2018 1355 Last data filed at 06/18/2018 1031 Gross per 24 hour  Intake 151.67 ml  Output --  Net  151.67 ml   Filed Weights   06/15/18 1105 06/17/18 1146 06/18/18 0702  Weight: 62.5 kg 62.5 kg 65.3 kg    Examination: General exam: Appears comfortable  HEENT: PERRLA, oral mucosa moist, no sclera icterus or thrush Respiratory system: Clear to auscultation. Respiratory effort normal. Cardiovascular system: S1 & S2 heard,  No murmurs  Gastrointestinal system: Abdomen soft, non-tender, nondistended. Normal bowel sounds   Central nervous system: Alert and oriented. No focal neurological deficits. Extremities: No cyanosis, clubbing or edema Skin: No rashes or ulcers Psychiatry:  Mood & affect appropriate.     Data Reviewed: I have personally reviewed following labs and imaging studies  CBC: Recent Labs  Lab 06/13/18 0411 06/15/18 0746 06/17/18 0243  WBC 5.9 7.8 9.4  HGB 8.1* 7.9* 8.0*  HCT  25.5* 25.1* 25.2*  MCV 78.5* 79.9* 79.0*  PLT 264 290 505   Basic Metabolic Panel: Recent Labs  Lab 06/15/18 0408 06/15/18 0746 06/16/18 0227 06/17/18 0243 06/18/18 0344  NA 138 138 135 136   136 135  K 3.3* 3.3* 4.8 3.2*   3.2* 3.5  CL 105 104 101 101   100 101  CO2 22 22 22 24   24 22   GLUCOSE 95 84 83 102*   102* 85  BUN 44* 44* 20 24*   24* 29*  CREATININE 10.32* 10.14* 6.00* 6.93*   6.96* 8.36*  CALCIUM 8.1* 8.1* 7.8* 7.9*   7.9* 7.8*  PHOS 6.0* 6.2* 3.6 4.2 6.1*   GFR: Estimated Creatinine Clearance: 7.3 mL/min (A) (by C-G formula based on SCr of 8.36 mg/dL (H)). Liver Function Tests: Recent Labs  Lab 06/15/18 0408 06/15/18 0746 06/16/18 0227 06/17/18 0243 06/18/18 0344  AST  --   --   --   --  18  ALT  --   --   --   --  25  ALKPHOS  --   --   --   --  38  BILITOT  --   --   --   --  0.5  PROT  --   --   --   --  4.5*  ALBUMIN 2.2* 2.1* 2.3* 2.3* 2.2*   No results for input(s): LIPASE, AMYLASE in the last 168 hours. No results for input(s): AMMONIA in the last 168 hours. Coagulation Profile: Recent Labs  Lab 06/15/18 1420 06/17/18 0243  INR 1.1 1.1   Cardiac Enzymes: No results for input(s): CKTOTAL, CKMB, CKMBINDEX, TROPONINI in the last 168 hours. BNP (last 3 results) No results for input(s): PROBNP in the last 8760 hours. HbA1C: No results for input(s): HGBA1C in the last 72 hours. CBG: Recent Labs  Lab 06/17/18 2016 06/17/18 2353 06/18/18 0357 06/18/18 0821 06/18/18 1229  GLUCAP 92 79 80 76 114*   Lipid Profile: No results for input(s): CHOL, HDL, LDLCALC, TRIG, CHOLHDL, LDLDIRECT in the last 72 hours. Thyroid Function Tests: No results for input(s): TSH, T4TOTAL, FREET4, T3FREE, THYROIDAB in the last 72 hours. Anemia Panel: No results for input(s): VITAMINB12, FOLATE, FERRITIN, TIBC, IRON, RETICCTPCT in the last 72 hours. Urine analysis:    Component Value Date/Time   COLORURINE AMBER (A) 06/06/2018 1042   APPEARANCEUR HAZY (A)  06/06/2018 1042   LABSPEC 1.020 06/06/2018 1042   PHURINE 5.0 06/06/2018 1042   GLUCOSEU 50 (A) 06/06/2018 1042   HGBUR LARGE (A) 06/06/2018 1042   BILIRUBINUR NEGATIVE 06/06/2018 1042   KETONESUR NEGATIVE 06/06/2018 1042   PROTEINUR 100 (A) 06/06/2018 1042   NITRITE NEGATIVE  06/06/2018 1042   LEUKOCYTESUR NEGATIVE 06/06/2018 1042   Sepsis Labs: @LABRCNTIP (procalcitonin:4,lacticidven:4) )No results found for this or any previous visit (from the past 240 hour(s)).       Radiology Studies: Vas [place] Upper Ext Vein Mapping (pre-op Avf)  Result Date: 06/17/2018 UPPER EXTREMITY VEIN MAPPING  Indications: Pre-access. Performing Technologist: June Leap RDMS, RVT  Examination Guidelines: A complete evaluation includes B-mode imaging, spectral Doppler, color Doppler, and power Doppler as needed of all accessible portions of each vessel. Bilateral testing is considered an integral part of a complete examination. Limited examinations for reoccurring indications may be performed as noted. +-----------------+-------------+----------+--------------+  Right Cephalic    Diameter (cm) Depth (cm)    Findings     +-----------------+-------------+----------+--------------+  Shoulder              0.22         0.74                    +-----------------+-------------+----------+--------------+  Prox upper arm                             not visualized  +-----------------+-------------+----------+--------------+  Mid upper arm                              not visualized  +-----------------+-------------+----------+--------------+  Dist upper arm                             not visualized  +-----------------+-------------+----------+--------------+  Antecubital fossa                          not visualized  +-----------------+-------------+----------+--------------+  Prox forearm          0.17                                 +-----------------+-------------+----------+--------------+  +-----------------+-------------+----------+--------+  Right Basilic     Diameter (cm) Depth (cm) Findings  +-----------------+-------------+----------+--------+  Mid upper arm         0.32         1.79              +-----------------+-------------+----------+--------+  Dist upper arm        0.24         1.38              +-----------------+-------------+----------+--------+  Antecubital fossa     0.24         1.00              +-----------------+-------------+----------+--------+ +-----------------+-------------+----------+-------------------------+  Left Cephalic     Diameter (cm) Depth (cm)         Findings           +-----------------+-------------+----------+-------------------------+  Shoulder              0.21         1.12                               +-----------------+-------------+----------+-------------------------+  Mid upper arm         0.18         0.45           thick walls         +-----------------+-------------+----------+-------------------------+  Dist upper arm        0.45                         thrombus           +-----------------+-------------+----------+-------------------------+  Antecubital fossa     0.18         0.26    branching and thick walls  +-----------------+-------------+----------+-------------------------+  Prox forearm          0.17                                            +-----------------+-------------+----------+-------------------------+  Mid forearm           0.10                                            +-----------------+-------------+----------+-------------------------+ +-----------------+-------------+----------+--------+  Left Basilic      Diameter (cm) Depth (cm) Findings  +-----------------+-------------+----------+--------+  Mid upper arm         0.21         1.36              +-----------------+-------------+----------+--------+  Dist upper arm        0.30         1.69              +-----------------+-------------+----------+--------+  Antecubital fossa      0.29         1.27              +-----------------+-------------+----------+--------+ *See table(s) above for measurements and observations.  Diagnosing physician: Curt Jews MD Electronically signed by Curt Jews MD on 06/17/2018 at 5:01:50 PM.    Final       Scheduled Meds:  aspirin  81 mg Oral Daily   Chlorhexidine Gluconate Cloth  6 each Topical Q0600   darbepoetin (ARANESP) injection - NON-DIALYSIS  100 mcg Subcutaneous Q Sat-1800   divalproex  125 mg Oral QHS   ferrous sulfate  325 mg Oral Q breakfast   fluticasone  1 spray Each Nare Daily   furosemide  80 mg Intravenous Q12H   insulin aspart  0-9 Units Subcutaneous Q4H   traZODone  150 mg Oral QHS   ziprasidone  60 mg Oral Daily   Continuous Infusions:  sodium chloride 10 mL/hr at 06/17/18 1156   sodium chloride       LOS: 14 days      Debbe Odea, MD [facility] Pager: [email or web address] Password [email or web address] 06/18/2018, 1:55 PM

## 2018-06-18 NOTE — Progress Notes (Signed)
Physical Therapy Treatment Patient Details Name: Chad Mathis MRN: [id number] DOB: 1955/06/04 Today's Date: 06/18/2018    History of Present Illness Pt is a 63 y.o. male admitted from ALF on 06/04/18 with nausea, vomiting and progressive weakness. Worked up for AKI and SIRS. PMH includes legally blind, bipolar disorder, DM2, asthma.    PT Comments    Patient demonstrated improved ability to ambulate and transfer today. He continues to require assistance but he did better today. He continues to have decreased endurance with mobility. He showed signs of pain but when asked where he was hurting he reported no pain. He would benefit from rehab at a SNF as well as continued skilled therapy at a SNF.   Follow Up Recommendations  SNF     Equipment Recommendations  Rolling walker with 5" wheels    Recommendations for Other Services Rehab consult     Precautions / Restrictions Precautions Precautions: Fall Restrictions Weight Bearing Restrictions: No    Mobility  Bed Mobility Overal bed mobility: Needs Assistance Bed Mobility: Sit to Supine       Sit to supine: Mod assist   General bed mobility comments: mod a to scoot to the edge of the bed   Transfers Overall transfer level: Needs assistance Equipment used: Rolling walker (2 wheeled) Transfers: Sit to/from Stand Sit to Stand: Min assist         General transfer comment: decreased assist required for strength and balance   Ambulation/Gait Ambulation/Gait assistance: Min assist Gait Distance (Feet): 15 Feet Assistive device: Rolling walker (2 wheeled) Gait Pattern/deviations: Step-through pattern Gait velocity: decreased Gait velocity interpretation: <1.31 ft/sec, indicative of household ambulator General Gait Details: slow but steady gait pattern. Min a for balance. Mod verabl and tactile cuing for direction 2nd to vision   Stairs             Wheelchair Mobility    Modified Rankin (Stroke Patients  Only) Modified Rankin (Stroke Patients Only) Pre-Morbid Rankin Score: Severe disability     Balance Overall balance assessment: Needs assistance Sitting-balance support: Bilateral upper extremity supported Sitting balance-Leahy Scale: Poor Sitting balance - Comments: still required close guarding    Standing balance support: Bilateral upper extremity supported Standing balance-Leahy Scale: Poor Standing balance comment: min a for balance                             Cognition Arousal/Alertness: Awake/alert Behavior During Therapy: WFL for tasks assessed/performed Overall Cognitive Status: Within Functional Limits for tasks assessed                                 General Comments: flat affect       Exercises      General Comments        Pertinent Vitals/Pain Pain Assessment: Faces Faces Pain Scale: Hurts little more Pain Location: generalized. Patienent grimaces like he is in pain but when asked where the pain is he says he is not in pain.  Pain Descriptors / Indicators: Grimacing Pain Intervention(s): Limited activity within patient's tolerance    Home Living                      Prior Function            PT Goals (current goals can now be found in the care plan section) Acute Rehab PT Goals Patient  Stated Goal: to get out of bed   PT Goal Formulation: With patient Time For Goal Achievement: 06/12/18 Potential to Achieve Goals: Good Progress towards PT goals: Progressing toward goals    Frequency    Min 2X/week      PT Plan Current plan remains appropriate    Co-evaluation              AM-PAC PT "6 Clicks" Mobility   Outcome Measure  Help needed turning from your back to your side while in a flat bed without using bedrails?: A Lot Help needed moving from lying on your back to sitting on the side of a flat bed without using bedrails?: A Lot Help needed moving to and from a bed to a chair (including a  wheelchair)?: A Little Help needed standing up from a chair using your arms (e.g., wheelchair or bedside chair)?: A Little Help needed to walk in hospital room?: A Lot Help needed climbing 3-5 steps with a railing? : Total 6 Click Score: 13    End of Session Equipment Utilized During Treatment: Gait belt Activity Tolerance: Patient tolerated treatment well Patient left: in chair;with call bell/phone within reach;with chair alarm set Nurse Communication: Mobility status PT Visit Diagnosis: Unsteadiness on feet (R26.81);Muscle weakness (generalized) (M62.81)     Time: 8088-1103 PT Time Calculation (min) (ACUTE ONLY): 14 min  Charges:  $Gait Training: 8-22 mins                        Carney Living PT DPT  06/18/2018, 4:20 PM

## 2018-06-18 NOTE — Plan of Care (Signed)
  Problem: Clinical Measurements: Goal: Ability to maintain clinical measurements within normal limits will improve Outcome: Progressing   Problem: Clinical Measurements: Goal: Will remain free from infection Outcome: Progressing   Problem: Activity: Goal: Risk for activity intolerance will decrease Outcome: Progressing   Problem: Pain Managment: Goal: General experience of comfort will improve Outcome: Progressing   Problem: Clinical Measurements: Goal: Dialysis access will remain free of complications Outcome: Progressing   Problem: Urinary Elimination: Goal: Progression of disease will be identified and treated Outcome: Progressing

## 2018-06-18 NOTE — Progress Notes (Signed)
Renal Navigator received call from CSW/V. Crawford who states she is covering for CSW/N. Rayyan. Renal Navigator informed CSW that patient is ready for discharge from Renal standpoint and asked to be informed when patient will be discharged to SNF so that Renal Navigator can update OP HD clinic/[facility] on patient's start date.  Alphonzo Cruise, [place] Renal Navigator [phone]

## 2018-06-18 NOTE — Progress Notes (Signed)
Renal Navigator left message for CSW/N. Rayyan regarding patient ready for discharge from Renal standpoint.  Alphonzo Cruise, [place] Renal Navigator [phone]

## 2018-06-18 NOTE — Progress Notes (Signed)
[facility] Progress Note    Assessment/ Plan:   AKI/CKD stage 3(last known Cr was 1.42 in Dec 2017). Oliguric presumably multifactorial; volume depletion due to N/V in setting of concomitant ACE inhibition and diureticswith superimposed rhabdomyolysis. s/p HD on 06/04/18 due to hyperkalemia. [place] without hydro but did show increased echogenicity consistent with chronic medical renal disease. Started HD 6/4 as felt badly and numbers do continue to worsen off HD vascath placed 5/28 -> RIJ temp changed to RIJ TC by VIR 6/9  Bladder scan6/8 only 152ml.  No signs of recovery and w/ h/o CKD IIIB in 2017 likely represents progression with poor reserve + rhabdo->appreciate VIR  converting to [facility] and Dr Trula Slade placing 1st stage lt BBF 6/10.  Last HD Monday 6/8 -> plan on HD today. Has been  CLIP'd successfully for [facility] MWF.    2. Metabolic acidosis- better. Will stop nahco3 as well. 3. Rhabdomyolysis- likely contributing to AKI but unclear etiology other than statin therapy. No history of falls or being found down.  follow levels, trending down 4. Anemia- presumably due to CKD, however unknown baseline Cr.  SPEP/UPEP neg, iron stores OK and follow. Started ESA 5. 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 SIRS- completed zosyn - covid-19 negative  6. DM- per primary svc 7. HTN- low BP's on arrival, meds on hold  Subjective:   Denies f/c/n/v/dyspnea/cp   Objective:   BP 107/65 (BP Location: Right Arm)   Pulse 75   Temp 98.8 F (37.1 C)   Resp 18   Ht 5' 2.99" (1.6 m)   Wt 65.3 kg   SpO2 100%   BMI 25.51 kg/m   Intake/Output Summary (Last 24 hours) at 06/18/2018 1302 Last data filed at 06/17/2018 1700 Gross per 24 hour  Intake 431.67 ml  Output 25 ml  Net 406.67 ml   Weight change:   Physical Exam: Gen:NAD CVS: no rub  Resp: cta Abd: benign Ext: pitting edema Access: RIJ TC, Lt BBF + bruit  Imaging: Vas [place] Upper Ext Vein Mapping (pre-op  Avf)  Result Date: 06/17/2018 UPPER EXTREMITY VEIN MAPPING  Indications: Pre-access. Performing Technologist: June Leap RDMS, RVT  Examination Guidelines: A complete evaluation includes B-mode imaging, spectral Doppler, color Doppler, and power Doppler as needed of all accessible portions of each vessel. Bilateral testing is considered an integral part of a complete examination. Limited examinations for reoccurring indications may be performed as noted. +-----------------+-------------+----------+--------------+ Right Cephalic   Diameter (cm)Depth (cm)   Findings    +-----------------+-------------+----------+--------------+ Shoulder             0.22        0.74                  +-----------------+-------------+----------+--------------+ Prox upper arm                          not visualized +-----------------+-------------+----------+--------------+ Mid upper arm                           not visualized +-----------------+-------------+----------+--------------+ Dist upper arm                          not visualized +-----------------+-------------+----------+--------------+ Antecubital fossa                       not visualized +-----------------+-------------+----------+--------------+ Prox forearm  0.17                              +-----------------+-------------+----------+--------------+ +-----------------+-------------+----------+--------+ Right Basilic    Diameter (cm)Depth (cm)Findings +-----------------+-------------+----------+--------+ Mid upper arm        0.32        1.79            +-----------------+-------------+----------+--------+ Dist upper arm       0.24        1.38            +-----------------+-------------+----------+--------+ Antecubital fossa    0.24        1.00            +-----------------+-------------+----------+--------+ +-----------------+-------------+----------+-------------------------+ Left Cephalic    Diameter  (cm)Depth (cm)        Findings          +-----------------+-------------+----------+-------------------------+ Shoulder             0.21        1.12                             +-----------------+-------------+----------+-------------------------+ Mid upper arm        0.18        0.45          thick walls        +-----------------+-------------+----------+-------------------------+ Dist upper arm       0.45                       thrombus          +-----------------+-------------+----------+-------------------------+ Antecubital fossa    0.18        0.26   branching and thick walls +-----------------+-------------+----------+-------------------------+ Prox forearm         0.17                                         +-----------------+-------------+----------+-------------------------+ Mid forearm          0.10                                         +-----------------+-------------+----------+-------------------------+ +-----------------+-------------+----------+--------+ Left Basilic     Diameter (cm)Depth (cm)Findings +-----------------+-------------+----------+--------+ Mid upper arm        0.21        1.36            +-----------------+-------------+----------+--------+ Dist upper arm       0.30        1.69            +-----------------+-------------+----------+--------+ Antecubital fossa    0.29        1.27            +-----------------+-------------+----------+--------+ *See table(s) above for measurements and observations.  Diagnosing physician: Curt Jews MD Electronically signed by Curt Jews MD on 06/17/2018 at 5:01:50 PM.    Final     Labs: BMET Recent Labs  Lab 06/13/18 0411 06/14/18 6759 06/15/18 0408 06/15/18 1638 06/16/18 0227 06/17/18 0243 06/18/18 0344  NA 138 138 138 138 135 136  136 135  K 3.6 3.4* 3.3* 3.3* 4.8 3.2*  3.2* 3.5  CL 104 103 105 104 101 101  100 101  CO2 22  21* 22 22 22 24  24 22   GLUCOSE 88 91 95 84 83 102*   102* 85  BUN 32* 40* 44* 44* 20 24*  24* 29*  CREATININE 8.09* 9.08* 10.32* 10.14* 6.00* 6.93*  6.96* 8.36*  CALCIUM 8.2* 8.4* 8.1* 8.1* 7.8* 7.9*  7.9* 7.8*  PHOS 4.7* 5.5* 6.0* 6.2* 3.6 4.2 6.1*   CBC Recent Labs  Lab 06/13/18 0411 06/15/18 0746 06/17/18 0243  WBC 5.9 7.8 9.4  HGB 8.1* 7.9* 8.0*  HCT 25.5* 25.1* 25.2*  MCV 78.5* 79.9* 79.0*  PLT 264 290 232    Medications:    . aspirin  81 mg Oral Daily  . Chlorhexidine Gluconate Cloth  6 each Topical Q0600  . darbepoetin (ARANESP) injection - NON-DIALYSIS  100 mcg Subcutaneous Q Sat-1800  . divalproex  125 mg Oral QHS  . ferrous sulfate  325 mg Oral Q breakfast  . fluticasone  1 spray Each Nare Daily  . furosemide  80 mg Intravenous Q12H  . insulin aspart  0-9 Units Subcutaneous Q4H  . traZODone  150 mg Oral QHS  . ziprasidone  60 mg Oral Daily      Otelia Santee, MD 06/18/2018, 1:02 PM

## 2018-06-18 NOTE — Progress Notes (Addendum)
  Postoperative hemodialysis access     Date of Surgery:  06/17/2018 Surgeon: Trula Slade  Subjective:  Denies any pain, numbness or tingling in his left hand  PHYSICAL EXAMINATION:  Vitals:   06/18/18 0529 06/18/18 0531  BP: (!) 92/58 (!) 93/57  Pulse: 75 73  Resp: 15   Temp: 98.8 F (37.1 C)   SpO2: 97%     Incision is clean and dry without hematoma Sensation in digits is intact;  There is  Thrill  The fistula is palpable  Left radial pulse is not palpable    ASSESSMENT/PLAN:  Chad Mathis is a 63 y.o. year old male who is s/p left 1st stage BVT by Dr. Trula Slade.  -graft/fistula is patent -pt does not have evidence of steal sx -f/u with Dr. Trula Slade in 4-6 weeks to check maturation of AVF and discuss 2nd stage BVT.  (our office will arrange appt) -will sign off-call as needed.   Leontine Locket, PA-C [facility] [phone]

## 2018-06-19 DIAGNOSIS — D638 Anemia in other chronic diseases classified elsewhere: Secondary | ICD-10-CM

## 2018-06-19 DIAGNOSIS — M6282 Rhabdomyolysis: Secondary | ICD-10-CM

## 2018-06-19 DIAGNOSIS — R7989 Other specified abnormal findings of blood chemistry: Secondary | ICD-10-CM

## 2018-06-19 DIAGNOSIS — R945 Abnormal results of liver function studies: Secondary | ICD-10-CM

## 2018-06-19 LAB — GLUCOSE, CAPILLARY
Glucose-Capillary: 62 mg/dL — ABNORMAL LOW (ref 70–99)
Glucose-Capillary: 68 mg/dL — ABNORMAL LOW (ref 70–99)
Glucose-Capillary: 72 mg/dL (ref 70–99)
Glucose-Capillary: 84 mg/dL (ref 70–99)
Glucose-Capillary: 88 mg/dL (ref 70–99)
Glucose-Capillary: 89 mg/dL (ref 70–99)
Glucose-Capillary: 89 mg/dL (ref 70–99)

## 2018-06-19 LAB — RENAL FUNCTION PANEL
Albumin: 2.1 g/dL — ABNORMAL LOW (ref 3.5–5.0)
Anion gap: 14 (ref 5–15)
BUN: 36 mg/dL — ABNORMAL HIGH (ref 8–23)
CO2: 22 mmol/L (ref 22–32)
Calcium: 7.9 mg/dL — ABNORMAL LOW (ref 8.9–10.3)
Chloride: 99 mmol/L (ref 98–111)
Creatinine, Ser: 9.43 mg/dL — ABNORMAL HIGH (ref 0.61–1.24)
GFR calc Af Amer: 6 mL/min — ABNORMAL LOW (ref >60)
GFR calc non Af Amer: 5 mL/min — ABNORMAL LOW (ref >60)
Glucose, Bld: 90 mg/dL (ref 70–99)
Phosphorus: 5.7 mg/dL — ABNORMAL HIGH (ref 2.5–4.6)
Potassium: 3.4 mmol/L — ABNORMAL LOW (ref 3.5–5.1)
Sodium: 135 mmol/L (ref 135–145)

## 2018-06-19 LAB — CBC
HCT: 25.1 % — ABNORMAL LOW (ref 39.0–52.0)
Hemoglobin: 7.6 g/dL — ABNORMAL LOW (ref 13.0–17.0)
MCH: 25 pg — ABNORMAL LOW (ref 26.0–34.0)
MCHC: 30.3 g/dL (ref 30.0–36.0)
MCV: 82.6 fL (ref 80.0–100.0)
Platelets: 216 10*3/uL (ref 150–400)
RBC: 3.04 MIL/uL — ABNORMAL LOW (ref 4.22–5.81)
RDW: 14.4 % (ref 11.5–15.5)
WBC: 8.9 10*3/uL (ref 4.0–10.5)
nRBC: 0 % (ref 0.0–0.2)

## 2018-06-19 LAB — SARS CORONAVIRUS 2: SARS Coronavirus 2: NOT DETECTED

## 2018-06-19 MED ORDER — HEPARIN SODIUM (PORCINE) 1000 UNIT/ML IJ SOLN
INTRAMUSCULAR | Status: AC
  Administered 2018-06-19: 10:00:00
  Filled 2018-06-19: qty 4

## 2018-06-19 MED ORDER — CALCIUM ACETATE (PHOS BINDER) 667 MG PO CAPS
667.0000 mg | ORAL_CAPSULE | Freq: Three times a day (TID) | ORAL | Status: DC
  Administered 2018-06-19 – 2018-06-20 (×3): 667 mg via ORAL
  Filled 2018-06-19 (×3): qty 1

## 2018-06-19 MED ORDER — CALCIUM ACETATE (PHOS BINDER) 667 MG PO CAPS: 667.0000 mg | ORAL_CAPSULE | Freq: Three times a day (TID) | ORAL | 0 refills | Status: AC

## 2018-06-19 NOTE — TOC Progression Note (Signed)
Transition of Care [facility]) - Progression Note    Patient Details  Name: Chad Mathis MRN: [id number] Date of Birth: 02/25/1955  Transition of Care [facility]) CM/SW [place], LCSW Phone Number: 06/19/2018, 4:48 PM  Clinical Narrative:    CSW received insurance approval info and provided to SNF. They are able to accept Saturday once COVID results are in. Patient's Guardian updated.    Expected Discharge Plan: Skilled Nursing Facility Barriers to Discharge: Insurance Authorization, No SNF bed(Facility search initiated 06/18/18)  Expected Discharge Plan and Services Expected Discharge Plan: [place] In-house Referral: Clinical Social Work Discharge Planning Services: Other - See comment(SNF placement) Post Acute Care Choice: [place] arrangements for the past 2 months: [place] Expected Discharge Date: 06/19/18               DME Arranged: N/A DME Agency: NA       HH Arranged: NA HH Agency: NA         Social Determinants of Health (SDOH) Interventions    Readmission Risk Interventions No flowsheet data found.

## 2018-06-19 NOTE — NC FL2 (Signed)
[place] MEDICAID FL2 LEVEL OF CARE SCREENING TOOL     IDENTIFICATION  Patient Name: Chad Mathis Birthdate: March 12, 1955 Sex: male Admission Date (Current Location): 06/04/2018  [facility] and [place] Number:  Herbalist and Address:  [facility] [place], [place] [address], [place], [place] [address]      Provider Number: [id number]  Attending Physician Name and Address:  Debbe Odea, MD  Relative Name and Phone Number:  Delfino Lovett [phone]    Current Level of Care: Hospital Recommended Level of Care: [place] Prior Approval Number:    Date Approved/Denied:   PASRR Number: [id number] E End date 07/09/18  Discharge Plan: SNF    Current Diagnoses: Patient Active Problem List   Diagnosis Date Noted  . Acute renal failure ([place]) 06/04/2018  . SIRS (systemic inflammatory response syndrome) ([place]) 06/04/2018  . Onychomycosis 02/26/2013  . Pain in lower limb 02/26/2013  . Diabetes mellitus ([place]) 02/26/2013    Orientation RESPIRATION BLADDER Height & Weight     Self, Time, Situation, Place  Normal Continent Weight: 145 lb 4.5 oz (65.9 kg) Height:  5' 2.99" (160 cm)  BEHAVIORAL SYMPTOMS/MOOD NEUROLOGICAL BOWEL NUTRITION STATUS      Continent Diet(Renal)  AMBULATORY STATUS COMMUNICATION OF NEEDS Skin   Limited Assist Verbally Surgical wounds(Closed incision on arm)                       Personal Care Assistance Level of Assistance  Bathing, Feeding, Dressing Bathing Assistance: Maximum assistance Feeding assistance: Limited assistance Dressing Assistance: Limited assistance     Functional Limitations Info  Sight, Hearing, Speech Sight Info: Impaired Hearing Info: Adequate Speech Info: Adequate    SPECIAL CARE FACTORS FREQUENCY  PT (By licensed PT), OT (By licensed OT)     PT Frequency: 5x/week OT Frequency: 3x/week            Contractures Contractures Info: Not present    Additional Factors Info   Code Status, Allergies, Psychotropic Code Status Info: Full Allergies Info: NKA Psychotropic Info: Trazadone; Giodan Insulin Sliding Scale Info: See dc summary for dose       Current Medications (06/19/2018):  This is the current hospital active medication list Current Facility-Administered Medications  Medication Dose Route Frequency Provider Last Rate Last Dose  . 0.9 %  sodium chloride infusion   Intravenous Continuous Dagoberto Ligas, PA-C 10 mL/hr at 06/17/18 1156    . acetaminophen (TYLENOL) tablet 650 mg  650 mg Oral Q6H PRN Dagoberto Ligas, PA-C       Or  . acetaminophen (TYLENOL) suppository 650 mg  650 mg Rectal Q6H PRN Dagoberto Ligas, PA-C      . aspirin chewable tablet 81 mg  81 mg Oral Daily Dagoberto Ligas, PA-C   81 mg at 06/18/18 1002  . calcium acetate (PHOSLO) capsule 667 mg  667 mg Oral TID WC Dwana Melena, MD      . Chlorhexidine Gluconate Cloth 2 % PADS 6 each  6 each Topical Q0600 Dagoberto Ligas, PA-C   6 each at 06/19/18 6160  . Darbepoetin Alfa (ARANESP) injection 100 mcg  100 mcg Subcutaneous Q Sat-1800 Dagoberto Ligas, PA-C   100 mcg at 06/13/18 1648  . divalproex (DEPAKOTE) DR tablet 125 mg  125 mg Oral QHS Dagoberto Ligas, PA-C   125 mg at 06/18/18 2001  . fluticasone (FLONASE) 50 MCG/ACT nasal spray 1 spray  1 spray Each Nare Daily Dagoberto Ligas, PA-C  1 spray at 06/18/18 1003  . HYDROcodone-acetaminophen (NORCO/VICODIN) 5-325 MG per tablet 1-2 tablet  1-2 tablet Oral Q4H PRN Dagoberto Ligas, PA-C   1 tablet at 06/18/18 0400  . ondansetron (ZOFRAN) injection 4 mg  4 mg Intravenous Q6H PRN Dagoberto Ligas, PA-C      . sodium chloride 0.9 % bolus 500 mL  500 mL Intravenous Once Dagoberto Ligas, PA-C      . temazepam (RESTORIL) capsule 7.5 mg  7.5 mg Oral QHS PRN Dagoberto Ligas, PA-C   7.5 mg at 06/16/18 2230  . traZODone (DESYREL) tablet 150 mg  150 mg Oral QHS Dagoberto Ligas, PA-C   150 mg at 06/18/18 2137  . ziprasidone (GEODON) capsule 60 mg   60 mg Oral Daily Dagoberto Ligas, PA-C   60 mg at 06/18/18 1815     Discharge Medications: Please see discharge summary for a list of discharge medications.  Relevant Imaging Results:  Relevant Lab Results:   Additional Information SSN: [id number]   COVID negative on 5/28  Benard Halsted, LCSW

## 2018-06-19 NOTE — TOC Progression Note (Signed)
Transition of Care [facility]) - Progression Note    Patient Details  Name: Chad Mathis MRN: [id number] Date of Birth: December 23, 1955  Transition of Care [facility]) CM/SW [place], LCSW Phone Number: 06/19/2018, 1:12 PM  Clinical Narrative:    CSW sent info into insurance for approval. SNF requesting updated COVID test. Notified MD.   Expected Discharge Plan: [place] Barriers to Discharge: Insurance Authorization, No SNF bed(Facility search initiated 06/18/18)  Expected Discharge Plan and Services Expected Discharge Plan: [place] In-house Referral: Clinical Social Work Discharge Planning Services: Other - See comment(SNF placement) Post Acute Care Choice: [place] arrangements for the past 2 months: [place] Expected Discharge Date: (unknown)               DME Arranged: N/A DME Agency: NA       HH Arranged: NA HH Agency: NA         Social Determinants of Health (SDOH) Interventions    Readmission Risk Interventions No flowsheet data found.

## 2018-06-19 NOTE — Discharge Summary (Addendum)
Physician Discharge Summary  Chad Mathis RXV:[id number] DOB: [birth year] DOA: 06/04/2018  PCP: Verner Chol, MD  Admit date: 06/04/2018 Discharge date: 06/20/2018  Admitted From: home Disposition:  SNF   Recommendations for Outpatient Follow-up:  1. F/u Hb intermittently  Discharge Condition:  stable   CODE STATUS:  Full code   Diet recommendation:  Renal and diabetic diet Consultations:  nephrology   IR   Discharge Diagnoses:  Principal Problem:   Acute renal failure ([place]) Active Problems:   Rhabdomyolysis   Elevated LFTs   Anemia of chronic disease   Diabetes mellitus ([place])  Legally blind Bipolar disorder AOCD     Brief Summary: Chad Mathis 63 y.o.malewith medical history significant ofdiabetes, bipolar disorder, HTN who is also legally blind.Patient reports nausea, vomiting and weakness for the last 3 days. He continued to take his home meds including HCTZ, Benazepril and Metformin.  CBG was 61.  Cr 8.86, K was 7.0, Lactic acid was 7.5 He was Tachypneic and tachycardic.  WBC  Was 13.5.   Hospital Course:  Principal Problem:   Acute renal failure on CKD 3- now ESRD - Cr was 1.42 in 2017 and then 8.86 when admitted - nephrology was consulted and he was started on HD on 5/28 - renal ultrasound consistent with chronic medical renal disease - unfortunately there has been no sign of renal recovery -he received tunneled cath on 6/9  - left basilic fistula created 5/09 -  CLIP completed- ready for d/c to SNF  Active Problems: Nausea/ vomiting/ leukocytosis with tachycardia and tachypnea-  ? Gastrotenteritis/ SIRS  - mostly occurring prior to admission- ? Uremic vs gastroenteritis  Rhabdomyolysis - CK 12,563 on 5/29- improved to 5,411 by 6/4 - the exact cause remains uncertain but may be related to the nausea and vomiting  Elevated LFTs - ? Due to rhabdomyolysis- have normalized- statin initially held but has been resumed     Diabetes  mellitus 2 - holding Glucophage for now- maintain diabetic diet - A1c 5.9 on 06/05/18  Bipolar disorder - cont Depakote and Geodon  Essential HTN - currently hypotensive- HCTZ and Benazepril have been on hold   Anemia of chronic disease - Hb ~ 7- 8 - follow - cont oral Iron  Legally Blind  Discharge Exam: Vitals:   06/20/18 0140 06/20/18 0416  BP: 105/67 109/72  Pulse: 85 83  Resp: 16 14  Temp: 98 F (36.7 C) 98.4 F (36.9 C)  SpO2: 98% 99%   Vitals:   06/19/18 1008 06/20/18 0140 06/20/18 0416 06/20/18 0424  BP: 97/64 105/67 109/72   Pulse: 72 85 83   Resp: 16 16 14    Temp: 98.5 F (36.9 C) 98 F (36.7 C) 98.4 F (36.9 C)   TempSrc: Oral Oral Oral   SpO2:  98% 99%   Weight:    65.5 kg  Height:        General: Pt is alert, awake, not in acute distress Cardiovascular: RRR, S1/S2 +, no rubs, no gallops Respiratory: CTA bilaterally, no wheezing, no rhonchi Abdominal: Soft, NT, ND, bowel sounds + Extremities: no edema, no cyanosis   Discharge Instructions  Discharge Instructions    Increase activity slowly   Complete by: As directed      Allergies as of 06/20/2018   No Known Allergies     Medication List    STOP taking these medications   hydrochlorothiazide 12.5 MG tablet Commonly known as: HYDRODIURIL   metFORMIN 1000 MG tablet Commonly known as:  GLUCOPHAGE     TAKE these medications   aspirin 81 MG chewable tablet Chew 81 mg by mouth daily.   calcium acetate 667 MG capsule Commonly known as: PHOSLO Take 1 capsule (667 mg total) by mouth 3 (three) times daily with meals.   divalproex 125 MG DR tablet Commonly known as: DEPAKOTE Take 125 mg by mouth at bedtime.   ferrous sulfate 325 (65 FE) MG tablet Take 325 mg by mouth daily with breakfast.   fluticasone 50 MCG/ACT nasal spray Commonly known as: FLONASE Place 1 spray into both nostrils daily.   gemfibrozil 600 MG tablet Commonly known as: LOPID Take 600 mg by mouth 2 (two)  times daily before a meal.   simvastatin 40 MG tablet Commonly known as: ZOCOR Take 40 mg by mouth daily at 6 PM.   traZODone 150 MG tablet Commonly known as: DESYREL Take 150 mg by mouth at bedtime. What changed: Another medication with the same name was removed. Continue taking this medication, and follow the directions you see here.   ziprasidone 60 MG capsule Commonly known as: GEODON Take 60 mg by mouth daily.       Contact information for follow-up providers    Serafina Mitchell, MD In 6 weeks.   Specialties: Vascular Surgery, Cardiology Why: Office will call you to arrange your appt (sent) Contact information: [place] [address] [phone]            Contact information for after-discharge care    Destination    [facility] .   Service: Skilled Nursing Contact information: [address] [place] [phone]                 No Known Allergies   Procedures/Studies:    US Renal  Result Date: 06/04/2018 CLINICAL DATA:  Weakness, hyperglycemic EXAM: RENAL / URINARY TRACT ULTRASOUND COMPLETE COMPARISON:  None. FINDINGS: Right Kidney: Renal measurements: 11.7 x 5.8 x 6 cm = volume: 211 mL. Parenchyma is hyperechoic to the adjacent liver. No mass or hydronephrosis visualized. Left Kidney: Renal measurements: 10.9 x 5.9 x 5.6 cm = volume: 186 mL. Echogenic parenchyma. No hydronephrosis. 1 x 0.8 cm cyst in the upper pole. Bladder: Appears normal for degree of bladder distention. IMPRESSION: 1. Echogenic renal parenchyma, a nonspecific indicator of medical renal disease. 2. No hydronephrosis. Electronically Signed   By: Lucrezia Europe M.D.   On: 06/04/2018 16:52   Ir Fluoro Guide Cv Line Right  Result Date: 06/16/2018 INDICATION: ACUTE KIDNEY INJURY EXAM: ULTRASOUND GUIDANCE FOR VASCULAR ACCESS RIGHT INTERNAL JUGULAR PERMANENT HEMODIALYSIS CATHETER Date:  06/16/2018 06/16/2018 11:53 am Radiologist:  M. Daryll Brod,  MD Guidance:  Ultrasound fluoroscopic FLUOROSCOPY TIME:  Fluoroscopy Time: 2 minutes 6 seconds (4 mGy). MEDICATIONS: Ancef 2 g 1 the procedure ANESTHESIA/SEDATION: Versed 1.0 mg IV; Fentanyl 50 mcg IV; Moderate Sedation Time:  20 minutes The patient was continuously monitored during the procedure by the interventional radiology nurse under my direct supervision. CONTRAST:  None. COMPLICATIONS: None immediate. PROCEDURE: Informed consent was obtained from the patient following explanation of the procedure, risks, benefits and alternatives. The patient understands, agrees and consents for the procedure. All questions were addressed. A time out was performed. Maximal barrier sterile technique utilized including caps, mask, sterile gowns, sterile gloves, large sterile drape, hand hygiene, and 2% chlorhexidine scrub. Under sterile conditions and local anesthesia, right internal jugular micropuncture venous access was performed with ultrasound. Images were obtained for documentation of the patent  right internal jugular vein. A guide wire was inserted followed by a transitional dilator. Next, a 0.035 guidewire was advanced into the IVC with a 5-French catheter. Measurements were obtained from the right venotomy site to the proximal right atrium. In the infraclavicular chest, a subcutaneous tunnel was created under sterile conditions and local anesthesia. 1% lidocaine with epinephrine was utilized for this. The 19 cm tip to cuff palindrome catheter was tunneled subcutaneously to the venotomy site and inserted into the SVC/RA junction through a valved peel-away sheath. Position was confirmed with fluoroscopy. Images were obtained for documentation. Blood was aspirated from the catheter followed by saline and heparin flushes. The appropriate volume and strength of heparin was instilled in each lumen. Caps were applied. The catheter was secured at the tunnel site with Gelfoam and a pursestring suture. The venotomy site was  closed with subcuticular Vicryl suture. Dermabond was applied to the small right neck incision. A dry sterile dressing was applied. The catheter is ready for use. No immediate complications. IMPRESSION: Ultrasound and fluoroscopically guided right internal jugular tunneled hemodialysis catheter (19 cm tip cuff palindrome catheter). Electronically Signed   By: Jerilynn Mages.  Shick M.D.   On: 06/16/2018 12:28   Ir US Guide Vasc Access Right  Result Date: 06/16/2018 INDICATION: ACUTE KIDNEY INJURY EXAM: ULTRASOUND GUIDANCE FOR VASCULAR ACCESS RIGHT INTERNAL JUGULAR PERMANENT HEMODIALYSIS CATHETER Date:  06/16/2018 06/16/2018 11:53 am Radiologist:  Jerilynn Mages. Daryll Brod, MD Guidance:  Ultrasound fluoroscopic FLUOROSCOPY TIME:  Fluoroscopy Time: 2 minutes 6 seconds (4 mGy). MEDICATIONS: Ancef 2 g 1 the procedure ANESTHESIA/SEDATION: Versed 1.0 mg IV; Fentanyl 50 mcg IV; Moderate Sedation Time:  20 minutes The patient was continuously monitored during the procedure by the interventional radiology nurse under my direct supervision. CONTRAST:  None. COMPLICATIONS: None immediate. PROCEDURE: Informed consent was obtained from the patient following explanation of the procedure, risks, benefits and alternatives. The patient understands, agrees and consents for the procedure. All questions were addressed. A time out was performed. Maximal barrier sterile technique utilized including caps, mask, sterile gowns, sterile gloves, large sterile drape, hand hygiene, and 2% chlorhexidine scrub. Under sterile conditions and local anesthesia, right internal jugular micropuncture venous access was performed with ultrasound. Images were obtained for documentation of the patent right internal jugular vein. A guide wire was inserted followed by a transitional dilator. Next, a 0.035 guidewire was advanced into the IVC with a 5-French catheter. Measurements were obtained from the right venotomy site to the proximal right atrium. In the infraclavicular chest, a  subcutaneous tunnel was created under sterile conditions and local anesthesia. 1% lidocaine with epinephrine was utilized for this. The 19 cm tip to cuff palindrome catheter was tunneled subcutaneously to the venotomy site and inserted into the SVC/RA junction through a valved peel-away sheath. Position was confirmed with fluoroscopy. Images were obtained for documentation. Blood was aspirated from the catheter followed by saline and heparin flushes. The appropriate volume and strength of heparin was instilled in each lumen. Caps were applied. The catheter was secured at the tunnel site with Gelfoam and a pursestring suture. The venotomy site was closed with subcuticular Vicryl suture. Dermabond was applied to the small right neck incision. A dry sterile dressing was applied. The catheter is ready for use. No immediate complications. IMPRESSION: Ultrasound and fluoroscopically guided right internal jugular tunneled hemodialysis catheter (19 cm tip cuff palindrome catheter). Electronically Signed   By: Jerilynn Mages.  Shick M.D.   On: 06/16/2018 12:28   Dg Chest Port 1 View  Result Date:  06/04/2018 CLINICAL DATA:  Worsening nausea vomiting for 3 days EXAM: PORTABLE CHEST 1 VIEW COMPARISON:  None. FINDINGS: There is a right jugular central venous catheter with the tip projecting over the SVC. There is no focal parenchymal opacity. There is no pleural effusion or pneumothorax. The heart and mediastinal contours are unremarkable. There is gaseous distension of the stomach. The osseous structures are unremarkable. IMPRESSION: No active disease. Electronically Signed   By: Kathreen Devoid   On: 06/04/2018 12:47   Vas [place] Upper Ext Vein Mapping (pre-op Avf)  Result Date: 06/17/2018 UPPER EXTREMITY VEIN MAPPING  Indications: Pre-access. Performing Technologist: June Leap RDMS, RVT  Examination Guidelines: A complete evaluation includes B-mode imaging, spectral Doppler, color Doppler, and power Doppler as needed of all accessible  portions of each vessel. Bilateral testing is considered an integral part of a complete examination. Limited examinations for reoccurring indications may be performed as noted. +-----------------+-------------+----------+--------------+ Right Cephalic   Diameter (cm)Depth (cm)   Findings    +-----------------+-------------+----------+--------------+ Shoulder             0.22        0.74                  +-----------------+-------------+----------+--------------+ Prox upper arm                          not visualized +-----------------+-------------+----------+--------------+ Mid upper arm                           not visualized +-----------------+-------------+----------+--------------+ Dist upper arm                          not visualized +-----------------+-------------+----------+--------------+ Antecubital fossa                       not visualized +-----------------+-------------+----------+--------------+ Prox forearm         0.17                              +-----------------+-------------+----------+--------------+ +-----------------+-------------+----------+--------+ Right Basilic    Diameter (cm)Depth (cm)Findings +-----------------+-------------+----------+--------+ Mid upper arm        0.32        1.79            +-----------------+-------------+----------+--------+ Dist upper arm       0.24        1.38            +-----------------+-------------+----------+--------+ Antecubital fossa    0.24        1.00            +-----------------+-------------+----------+--------+ +-----------------+-------------+----------+-------------------------+ Left Cephalic    Diameter (cm)Depth (cm)        Findings          +-----------------+-------------+----------+-------------------------+ Shoulder             0.21        1.12                             +-----------------+-------------+----------+-------------------------+ Mid upper arm        0.18         0.45          thick walls        +-----------------+-------------+----------+-------------------------+ Dist upper arm       0.45  thrombus          +-----------------+-------------+----------+-------------------------+ Antecubital fossa    0.18        0.26   branching and thick walls +-----------------+-------------+----------+-------------------------+ Prox forearm         0.17                                         +-----------------+-------------+----------+-------------------------+ Mid forearm          0.10                                         +-----------------+-------------+----------+-------------------------+ +-----------------+-------------+----------+--------+ Left Basilic     Diameter (cm)Depth (cm)Findings +-----------------+-------------+----------+--------+ Mid upper arm        0.21        1.36            +-----------------+-------------+----------+--------+ Dist upper arm       0.30        1.69            +-----------------+-------------+----------+--------+ Antecubital fossa    0.29        1.27            +-----------------+-------------+----------+--------+ *See table(s) above for measurements and observations.  Diagnosing physician: Curt Jews MD Electronically signed by Curt Jews MD on 06/17/2018 at 5:01:50 PM.    Final      The results of significant diagnostics from this hospitalization (including imaging, microbiology, ancillary and laboratory) are listed below for reference.     Microbiology: Recent Results (from the past 240 hour(s))  SARS Coronavirus 2     Status: None   Collection Time: 06/19/18 12:32 PM  Result Value Ref Range Status   SARS Coronavirus 2 NOT DETECTED NOT DETECTED Final    Comment: (NOTE) SARS-CoV-2 target nucleic acids are NOT DETECTED. The SARS-CoV-2 RNA is generally detectable in upper and lower respiratory specimens during the acute phase of infection.  Negative  results do not  preclude SARS-CoV-2 infection, do not rule out co-infections with other pathogens, and should not be used as the sole basis for treatment or other patient management decisions.  Negative results must be combined with clinical observations, patient history, and epidemiological information. The expected result is Not Detected. Fact Sheet for Patients: http://www.biofiredefense.com/wp-content/uploads/2020/03/BIOFIRE-COVID -19-patients.pdf Fact Sheet for Healthcare Providers: http://www.biofiredefense.com/wp-content/uploads/2020/03/BIOFIRE-COVID -19-hcp.pdf This test is not yet approved or cleared by the [place] and  has been authorized for detection and/or diagnosis of SARS-CoV-2 by FDA under an Emergency Use Authorization (EUA).  This EUA will remain in effec t (meaning this test can be used) for the duration of  the COVID-19 declaration under Section 564(b)(1) of the Act, 21 U.S.C. section 360bbb-3(b)(1), unless the authorization is terminated or revoked sooner. Performed at [facility], [place] [address] [place], [place] [address]      Labs: BNP (last 3 results) No results for input(s): BNP in the last 8760 hours. Basic Metabolic Panel: Recent Labs  Lab 06/16/18 0227 06/17/18 0243 06/18/18 0344 06/19/18 0436 06/20/18 0247  NA 135 136  136 135 135 134*  K 4.8 3.2*  3.2* 3.5 3.4* 3.9  CL 101 101  100 101 99 100  CO2 22 24  24 22 22 24   GLUCOSE 83 102*  102* 85 90 91  BUN 20 24*  24* 29* 36* 17  CREATININE 6.00* 6.93*  6.96* 8.36* 9.43* 5.91*  CALCIUM 7.8* 7.9*  7.9* 7.8* 7.9* 7.6*  PHOS 3.6 4.2 6.1* 5.7* 3.6   Liver Function Tests: Recent Labs  Lab 06/16/18 0227 06/17/18 0243 06/18/18 0344 06/19/18 0436 06/20/18 0247  AST  --   --  18  --   --   ALT  --   --  25  --   --   ALKPHOS  --   --  38  --   --   BILITOT  --   --  0.5  --   --   PROT  --   --  4.5*  --   --   ALBUMIN 2.3* 2.3* 2.2* 2.1* 2.0*   No results for input(s): LIPASE,  AMYLASE in the last 168 hours. No results for input(s): AMMONIA in the last 168 hours. CBC: Recent Labs  Lab 06/15/18 0746 06/17/18 0243 06/19/18 0728  WBC 7.8 9.4 8.9  HGB 7.9* 8.0* 7.6*  HCT 25.1* 25.2* 25.1*  MCV 79.9* 79.0* 82.6  PLT 290 232 216   Cardiac Enzymes: No results for input(s): CKTOTAL, CKMB, CKMBINDEX, TROPONINI in the last 168 hours. BNP: Invalid input(s): POCBNP CBG: Recent Labs  Lab 06/19/18 1702 06/19/18 1741 06/20/18 0020 06/20/18 0412 06/20/18 0746  GLUCAP 68* 84 75 79 85   D-Dimer No results for input(s): DDIMER in the last 72 hours. Hgb A1c No results for input(s): HGBA1C in the last 72 hours. Lipid Profile No results for input(s): CHOL, HDL, LDLCALC, TRIG, CHOLHDL, LDLDIRECT in the last 72 hours. Thyroid function studies No results for input(s): TSH, T4TOTAL, T3FREE, THYROIDAB in the last 72 hours.  Invalid input(s): FREET3 Anemia work up No results for input(s): VITAMINB12, FOLATE, FERRITIN, TIBC, IRON, RETICCTPCT in the last 72 hours. Urinalysis    Component Value Date/Time   COLORURINE AMBER (A) 06/06/2018 1042   APPEARANCEUR HAZY (A) 06/06/2018 1042   LABSPEC 1.020 06/06/2018 1042   PHURINE 5.0 06/06/2018 1042   GLUCOSEU 50 (A) 06/06/2018 1042   HGBUR LARGE (A) 06/06/2018 1042   BILIRUBINUR NEGATIVE 06/06/2018 1042   KETONESUR NEGATIVE 06/06/2018 1042   PROTEINUR 100 (A) 06/06/2018 1042   NITRITE NEGATIVE 06/06/2018 1042   LEUKOCYTESUR NEGATIVE 06/06/2018 1042   Sepsis Labs Invalid input(s): PROCALCITONIN,  WBC,  LACTICIDVEN Microbiology Recent Results (from the past 240 hour(s))  SARS Coronavirus 2     Status: None   Collection Time: 06/19/18 12:32 PM  Result Value Ref Range Status   SARS Coronavirus 2 NOT DETECTED NOT DETECTED Final    Comment: (NOTE) SARS-CoV-2 target nucleic acids are NOT DETECTED. The SARS-CoV-2 RNA is generally detectable in upper and lower respiratory specimens during the acute phase of infection.   Negative  results do not preclude SARS-CoV-2 infection, do not rule out co-infections with other pathogens, and should not be used as the sole basis for treatment or other patient management decisions.  Negative results must be combined with clinical observations, patient history, and epidemiological information. The expected result is Not Detected. Fact Sheet for Patients: http://www.biofiredefense.com/wp-content/uploads/2020/03/BIOFIRE-COVID -19-patients.pdf Fact Sheet for Healthcare Providers: http://www.biofiredefense.com/wp-content/uploads/2020/03/BIOFIRE-COVID -19-hcp.pdf This test is not yet approved or cleared by the [place] and  has been authorized for detection and/or diagnosis of SARS-CoV-2 by FDA under an Emergency Use Authorization (EUA).  This EUA will remain in effec t (meaning this test can be used) for the duration of  the COVID-19 declaration under Section 564(b)(1) of the Act,  21 U.S.C. section 360bbb-3(b)(1), unless the authorization is terminated or revoked sooner. Performed at [facility], [place] [address], [place], [place] [address]      Time coordinating discharge in minutes: 30  SIGNED:   Debbe Odea, MD  [facility] 06/20/2018, 10:17 AM Pager   If 7PM-7AM, please contact night-coverage [email or web address] Password [email or web address]

## 2018-06-19 NOTE — Progress Notes (Signed)
Chad Mathis Progress Note    Assessment/ Plan:   AKI/CKD stage 3(last known Cr was 1.42 in Dec 2017). Oliguric presumably multifactorial; volume depletion due to N/V in setting of concomitant ACE inhibition and diureticswith superimposed rhabdomyolysis. s/p HD on 06/04/18 due to hyperkalemia. [place] without hydro but did show increased echogenicity consistent with chronic medical renal disease.StartedHD 6/4 as felt badly and numbers do continue to worsen off HD vascath placed 5/28-> RIJ temp changed to RIJ TC by VIR 6/9  Bladder scan6/8 only 150ml.  No signs of recovery and w/ h/o CKD IIIB in 2017 likely represents progression with poor reserve + rhabdo->appreciate VIRconvertingto TDC and Dr Trula Slade placing 1st stage lt BBF 6/10.   For this week has HD Mon and Fr. Seen just after HD stopped bec filter clotted off. Able to UF 2.3L net.  Plan next HD for Monday if he's still here.  Has been CLIP'd successfully for [facility] MWF 12:05P start time.    2. Metabolic acidosis- better. Stopped nahco3 now that he's on HD. 3. Rhabdomyolysis- likely contributing to AKI but unclear etiology other than statin therapy. No history of falls or being found down.  follow levels, trending down 4. Anemia- presumably due to CKD, however unknown baseline Cr.  SPEP/UPEP neg, iron stores OK and follow. Started ESA (Aranesp 163mcg 6/6) 5. 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 SIRS- completed zosyn - covid-19 negative  6. DM- per primary svc 7. HTN- low BP's on arrival, meds on hold  Subjective:   Denies f/c/n/v/dyspnea/cp Seen at the HD unit just off   Objective:   BP 94/64   Pulse 87   Temp 98.6 F (37 C) (Oral)   Resp 10   Ht 5' 2.99" (1.6 m)   Wt 65.9 kg   SpO2 98%   BMI 25.74 kg/m   Intake/Output Summary (Last 24 hours) at 06/19/2018 1030 Last data filed at 06/18/2018 1031 Gross per 24 hour  Intake 120 ml  Output -  Net  120 ml   Weight change: 2.8 kg  Physical Exam: Gen:NAD CVS: no rub  Resp: cta Abd: benign Ext: pitting edema Access: RIJTC, Lt BBF + bruit  Imaging: No results found.  Labs: BMET Recent Labs  Lab 06/14/18 0450 06/15/18 0408 06/15/18 0746 06/16/18 0227 06/17/18 0243 06/18/18 0344 06/19/18 0436  NA 138 138 138 135 136  136 135 135  K 3.4* 3.3* 3.3* 4.8 3.2*  3.2* 3.5 3.4*  CL 103 105 104 101 101  100 101 99  CO2 21* 22 22 22 24  24 22 22   GLUCOSE 91 95 84 83 102*  102* 85 90  BUN 40* 44* 44* 20 24*  24* 29* 36*  CREATININE 9.08* 10.32* 10.14* 6.00* 6.93*  6.96* 8.36* 9.43*  CALCIUM 8.4* 8.1* 8.1* 7.8* 7.9*  7.9* 7.8* 7.9*  PHOS 5.5* 6.0* 6.2* 3.6 4.2 6.1* 5.7*   CBC Recent Labs  Lab 06/13/18 0411 06/15/18 0746 06/17/18 0243 06/19/18 0728  WBC 5.9 7.8 9.4 8.9  HGB 8.1* 7.9* 8.0* 7.6*  HCT 25.5* 25.1* 25.2* 25.1*  MCV 78.5* 79.9* 79.0* 82.6  PLT 264 290 232 216    Medications:    . aspirin  81 mg Oral Daily  . Chlorhexidine Gluconate Cloth  6 each Topical Q0600  . darbepoetin (ARANESP) injection - NON-DIALYSIS  100 mcg Subcutaneous Q Sat-1800  . divalproex  125 mg Oral QHS  . fluticasone  1 spray  Each Nare Daily  . traZODone  150 mg Oral QHS  . ziprasidone  60 mg Oral Daily      Otelia Santee, MD 06/19/2018, 10:30 AM

## 2018-06-19 NOTE — Progress Notes (Signed)
Hypoglycemic Event  CBG: 62  Treatment: Apple Juice   Symptoms: Asymptomatic  Follow-up CBG: CBG: 88  Possible Reasons for Event: Poor Appetite  Comments/MD notified: Rizwan MD notified    [place]

## 2018-06-19 NOTE — Plan of Care (Signed)
  Problem: Clinical Measurements: Goal: Respiratory complications will improve Outcome: Progressing   Problem: Fluid Volume: Goal: Fluid volume balance will be maintained or improved Outcome: Progressing   Problem: Pain Managment: Goal: General experience of comfort will improve Outcome: Progressing   Problem: Activity: Goal: Risk for activity intolerance will decrease Outcome: Progressing   Problem: Clinical Measurements: Goal: Will remain free from infection Outcome: Progressing

## 2018-06-19 NOTE — Progress Notes (Signed)
Hypoglycemic Event  CBG:68  Treatment: Pt. Eating dinner.  Symptoms: pt asymptomatic Follow-up CBG: 84 Possible Reasons for Event:Poor appetite.  Comments/MD notified Rizwan MD notified    [place]

## 2018-06-19 NOTE — Progress Notes (Signed)
Patient is off unit to dialysis. Report given to Dub Mikes, RN at dialysis

## 2018-06-19 NOTE — TOC Progression Note (Signed)
Transition of Care [facility]) - Progression Note    Patient Details  Name: Chad Mathis MRN: [id number] Date of Birth: March 20, 1955  Transition of Care [facility]) CM/SW [place], LCSW Phone Number: 06/19/2018, 10:58 AM  Clinical Narrative:    CSW sent info to insurance for approval.   Expected Discharge Plan: [place] Barriers to Discharge: Insurance Authorization, No SNF bed(Facility search initiated 06/18/18)  Expected Discharge Plan and Services Expected Discharge Plan: [place] In-house Referral: Clinical Social Work Discharge Planning Services: Other - See comment(SNF placement) Post Acute Care Choice: [place] arrangements for the past 2 months: [place] Expected Discharge Date: (unknown)               DME Arranged: N/A DME Agency: NA       HH Arranged: NA HH Agency: NA         Social Determinants of Health (SDOH) Interventions    Readmission Risk Interventions No flowsheet data found.

## 2018-06-20 LAB — GLUCOSE, CAPILLARY
Glucose-Capillary: 139 mg/dL — ABNORMAL HIGH (ref 70–99)
Glucose-Capillary: 75 mg/dL (ref 70–99)
Glucose-Capillary: 79 mg/dL (ref 70–99)
Glucose-Capillary: 85 mg/dL (ref 70–99)

## 2018-06-20 NOTE — Plan of Care (Addendum)
Nsg Discharge Note  Admit Date:  06/04/2018 Discharge date: 06/20/2018   [name] to be D/C'd Skilled nursing facility per MD order.  AVS completed.  Copy for chart, and copy for patient signed, and dated. Patient/caregiver able to verbalize understanding.  Discharge Medication: Allergies as of 06/20/2018   No Known Allergies      Medication List     STOP taking these medications    hydrochlorothiazide 12.5 MG tablet Commonly known as: HYDRODIURIL   metFORMIN 1000 MG tablet Commonly known as: GLUCOPHAGE       TAKE these medications    aspirin 81 MG chewable tablet Chew 81 mg by mouth daily.   calcium acetate 667 MG capsule Commonly known as: PHOSLO Take 1 capsule (667 mg total) by mouth 3 (three) times daily with meals.   divalproex 125 MG DR tablet Commonly known as: DEPAKOTE Take 125 mg by mouth at bedtime.   ferrous sulfate 325 (65 FE) MG tablet Take 325 mg by mouth daily with breakfast.   fluticasone 50 MCG/ACT nasal spray Commonly known as: FLONASE Place 1 spray into both nostrils daily.   gemfibrozil 600 MG tablet Commonly known as: LOPID Take 600 mg by mouth 2 (two) times daily before a meal.   simvastatin 40 MG tablet Commonly known as: ZOCOR Take 40 mg by mouth daily at 6 PM.   traZODone 150 MG tablet Commonly known as: DESYREL Take 150 mg by mouth at bedtime. What changed: Another medication with the same name was removed. Continue taking this medication, and follow the directions you see here.   ziprasidone 60 MG capsule Commonly known as: GEODON Take 60 mg by mouth daily.        Discharge Assessment: Vitals:   06/20/18 0140 06/20/18 0416  BP: 105/67 109/72  Pulse: 85 83  Resp: 16 14  Temp: 98 F (36.7 C) 98.4 F (36.9 C)  SpO2: 98% 99%   Skin clean, dry and intact without evidence of skin break down, no evidence of skin tears noted. IV catheter discontinued intact. Site without signs and symptoms of complications - no  redness or edema noted at insertion site, patient denies c/o pain - only slight tenderness at site.  Dressing with slight pressure applied.  D/c Instructions-Education: Discharge instructions given to patient/family with verbalized understanding. D/c education completed with patient/family including follow up instructions, medication list, d/c activities limitations if indicated, with other d/c instructions as indicated by MD - patient able to verbalize understanding, all questions fully answered. Patient instructed to return to ED, call 911, or call MD for any changes in condition.  Patient transported via [facility] to [place] [facility]. Report given to [place] at [place] [place], RN 06/20/2018 12:47 PM

## 2018-06-20 NOTE — TOC Transition Note (Signed)
Transition of Care [facility]) - CM/SW Discharge Note   Patient Details  Name: Chad Mathis MRN: [id number] Date of Birth: March 16, 1955  Transition of [facility]) CM/SW Contact:  Gabrielle Dare Phone Number: 06/20/2018, 11:56 AM   Clinical Narrative:   Patient will Discharge To: [place] Anticipated DC Date:06/20/2018 Family Notified:yes legal guardian Ledon Snare [phone] Transport LX:[facility]IO   Per MD patient ready for DC to [place] . RN, patient, patient's family, and facility notified of DC. Assessment, Fl2/Pasrr, and Discharge Summary sent to facility. RN given number for report [phone] Room 203). DC packet on chart. Ambulance transport requested for patient.   CSW signing off.  Reed Breech [facility] [phone]      Final next level of care: Skilled Nursing Facility Barriers to Discharge: No Barriers Identified   Patient Goals and CMS Choice Patient states their goals for this hospitalization and ongoing recovery are:: Patient's guardian wants him placed in a skilled nursing facility for ST rehab and transition to LTC due to his health issues. CMS Medicare.gov Compare Post Acute Care list provided to:: Other (Comment Required)(Guardian provided CSW with facility preferences) Choice offered to / list presented to : NA(Guardian provided CSW with SNF preferences)  Discharge Placement              Patient chooses bed at: [facility] Patient to be transferred to facility by: [place] Name of family member notified: Ledon Snare Legal Guardian Patient and family notified of of transfer: 06/20/18  Discharge Plan and Services In-house Referral: Clinical Social Work Discharge Planning Services: Other - See comment(SNF placement) Post Acute Care Choice: [place]          DME Arranged: N/A DME Agency: NA       HH Arranged: NA HH Agency: NA        Social Determinants of Health (SDOH) Interventions     Readmission Risk  Interventions No flowsheet data found.

## 2018-06-20 NOTE — Progress Notes (Signed)
[facility] Progress Note    Assessment/ Plan:   AKI/CKD stage 3(last known Cr was 1.42 in Dec 2017). Oliguric presumably multifactorial; volume depletion due to N/V in setting of concomitant ACE inhibition and diureticswith superimposed rhabdomyolysis. s/p HD on 06/04/18 due to hyperkalemia. [place] without hydro but did show increased echogenicity consistent with chronic medical renal disease.StartedHD 6/4 as felt badly and numbers do continue to worsen off HD vascath placed 5/28-> RIJ temp changed to RIJ TC by VIR 6/9  Bladder scan6/8 only 144ml.  No signs of recovery and w/ h/o CKD IIIB in 2017 likely represents progression with poor reserve + rhabdo->appreciate VIRconvertingto TDC andDr Brabham placing 1st stage lt BBF 6/10.   For this week has HD Mon and Fr. Able to UF 2.3L net on 6/12.  Plan next HD for Monday if he's still here.  Has beenCLIP'd successfully for [facility] MWF 12:05P start time.    2. Metabolic acidosis- better. Stopped nahco3 now that he's on HD. 3. Rhabdomyolysis- likely contributing to AKI but unclear etiology other than statin therapy. No history of falls or being found down.  follow levels, trending down 4. Anemia- presumably due to CKD, however unknown baseline Cr.  SPEP/UPEP neg, iron stores OK and follow. 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 SIRS- completed zosyn - covid-19 negative  6. DM- per primary svc 7. HTN- low BP's on arrival, meds on hold  Subjective:   Denies f/c/n/v/dyspnea/cp No events overnight   Objective:   BP 109/72 (BP Location: Right Arm)   Pulse 83   Temp 98.4 F (36.9 C) (Oral)   Resp 14   Ht 5' 2.99" (1.6 m)   Wt 65.5 kg   SpO2 99%   BMI 25.59 kg/m   Intake/Output Summary (Last 24 hours) at 06/20/2018 1005 Last data filed at 06/20/2018 0900 Gross per 24 hour  Intake 574 ml  Output  1812 ml  Net -1238 ml   Weight change: 0.2 kg  Physical Exam: Gen:NAD CVS: no rub  Resp: cta Abd: benign Ext: pitting edema Access: RIJTC, Lt BBF + bruit  Imaging: No results found.  Labs: BMET Recent Labs  Lab 06/15/18 0408 06/15/18 0746 06/16/18 0227 06/17/18 0243 06/18/18 0344 06/19/18 0436 06/20/18 0247  NA 138 138 135 136  136 135 135 134*  K 3.3* 3.3* 4.8 3.2*  3.2* 3.5 3.4* 3.9  CL 105 104 101 101  100 101 99 100  CO2 22 22 22 24  24 22 22 24   GLUCOSE 95 84 83 102*  102* 85 90 91  BUN 44* 44* 20 24*  24* 29* 36* 17  CREATININE 10.32* 10.14* 6.00* 6.93*  6.96* 8.36* 9.43* 5.91*  CALCIUM 8.1* 8.1* 7.8* 7.9*  7.9* 7.8* 7.9* 7.6*  PHOS 6.0* 6.2* 3.6 4.2 6.1* 5.7* 3.6   CBC Recent Labs  Lab 06/15/18 0746 06/17/18 0243 06/19/18 0728  WBC 7.8 9.4 8.9  HGB 7.9* 8.0* 7.6*  HCT 25.1* 25.2* 25.1*  MCV 79.9* 79.0* 82.6  PLT 290 232 216    Medications:    . aspirin  81 mg Oral Daily  . calcium acetate  667 mg Oral TID WC  . Chlorhexidine Gluconate Cloth  6 each Topical Q0600  . darbepoetin (ARANESP) injection - NON-DIALYSIS  100 mcg Subcutaneous Q Sat-1800  . divalproex  125 mg Oral QHS  . fluticasone  1 spray Each Nare Daily  . traZODone  150 mg Oral QHS  . ziprasidone  60 mg Oral Daily      Otelia Santee, MD 06/20/2018, 10:05 AM

## 2018-06-20 NOTE — Progress Notes (Signed)
CSW spoke with physician concerning updating discharge summary for disposition.  RN Loletha Carrow was updated.  Reed Breech LCSWA [phone]

## 2018-06-21 LAB — RENAL FUNCTION PANEL
Albumin: 2 g/dL — ABNORMAL LOW (ref 3.5–5.0)
Anion gap: 10 (ref 5–15)
BUN: 17 mg/dL (ref 8–23)
CO2: 24 mmol/L (ref 22–32)
Calcium: 7.6 mg/dL — ABNORMAL LOW (ref 8.9–10.3)
Chloride: 100 mmol/L (ref 98–111)
Creatinine, Ser: 5.91 mg/dL — ABNORMAL HIGH (ref 0.61–1.24)
GFR calc Af Amer: 11 mL/min — ABNORMAL LOW (ref >60)
GFR calc non Af Amer: 9 mL/min — ABNORMAL LOW (ref >60)
Glucose, Bld: 91 mg/dL (ref 70–99)
Phosphorus: 3.6 mg/dL (ref 2.5–4.6)
Potassium: 3.9 mmol/L (ref 3.5–5.1)
Sodium: 134 mmol/L — ABNORMAL LOW (ref 135–145)

## 2018-07-27 ENCOUNTER — Ambulatory Visit (HOSPITAL_COMMUNITY): Payer: Medicare HMO

## 2018-08-14 ENCOUNTER — Ambulatory Visit (HOSPITAL_COMMUNITY): Payer: Medicare HMO

## 2019-12-31 IMAGING — DX PORTABLE CHEST - 1 VIEW
1 series · 1 of 1 positions shown · non-contrast
Comparison: None.

CLINICAL DATA: Worsening nausea vomiting for 3 days

EXAM:
PORTABLE CHEST 1 VIEW

[chest ap]
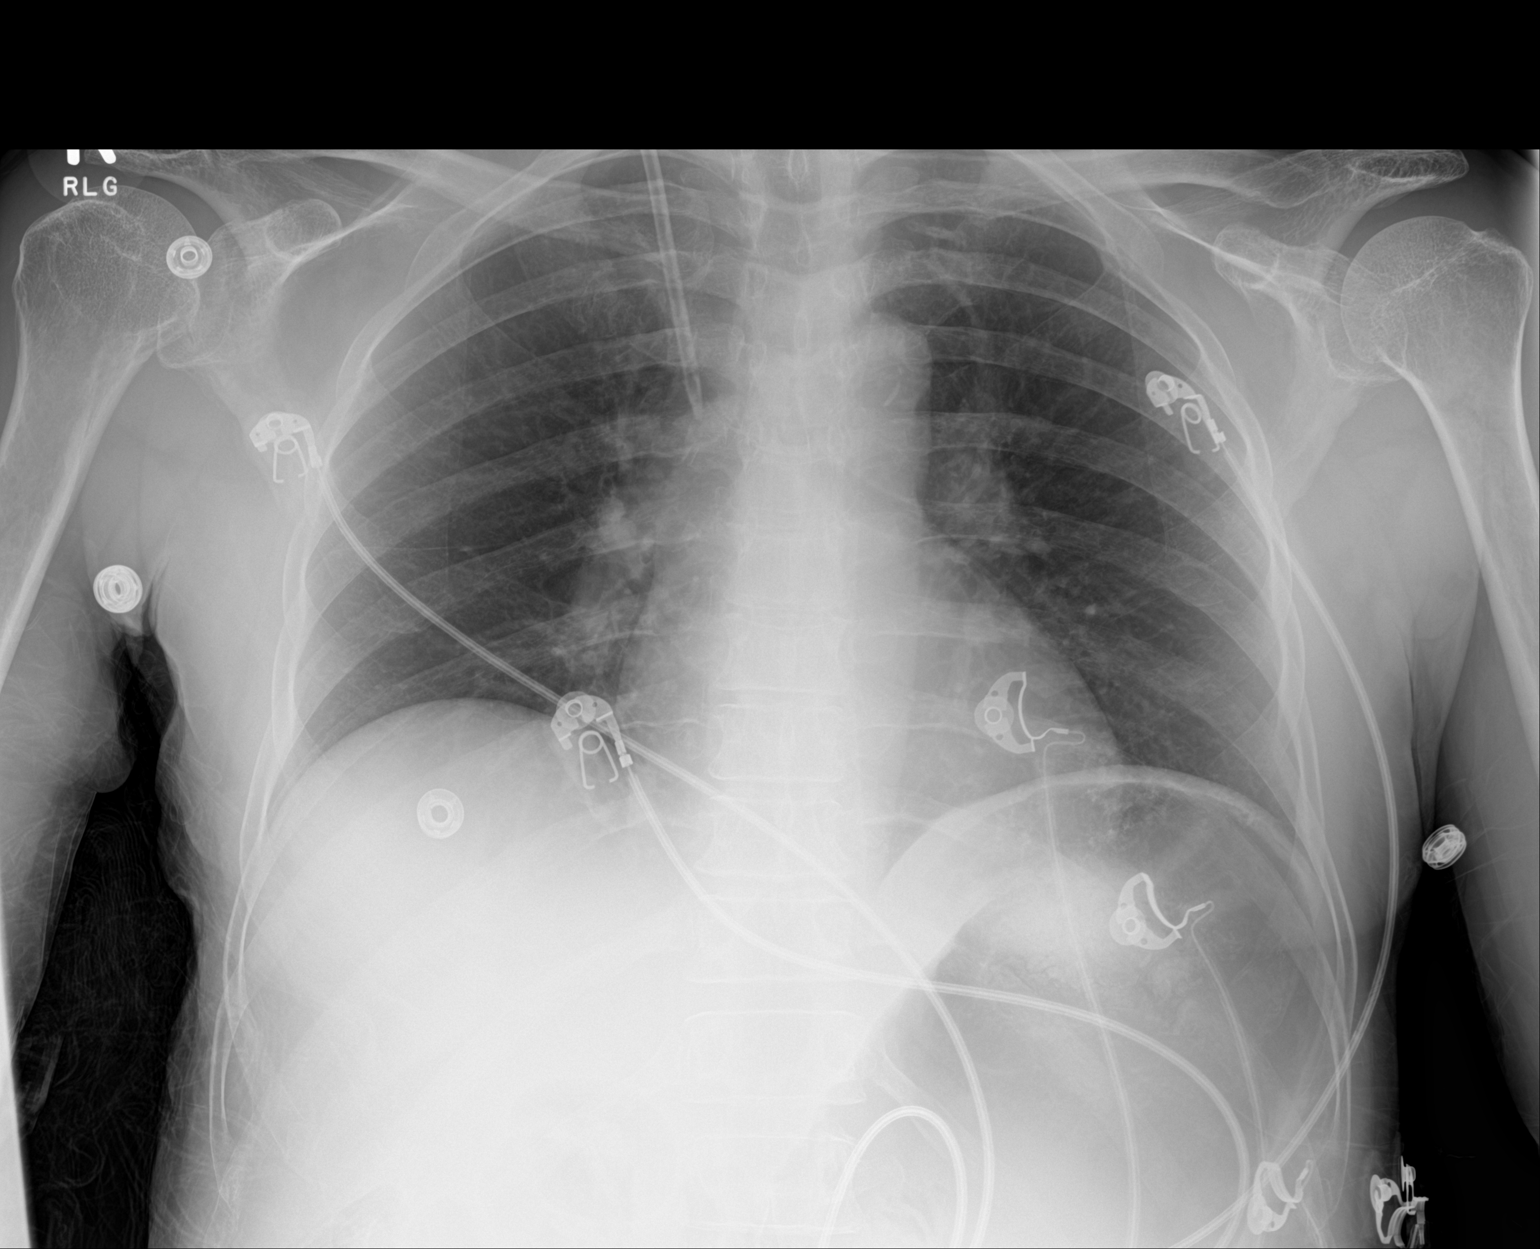

[1 of 1 positions shown; findings below may reference images not displayed]

FINDINGS: There is a right jugular central venous catheter with the tip
projecting over the SVC. There is no focal parenchymal opacity.
There is no pleural effusion or pneumothorax. The heart and
mediastinal contours are unremarkable. There is gaseous distension
of the stomach.

The osseous structures are unremarkable.
IMPRESSION: No active disease.

## 2020-01-12 IMAGING — US IR RIGHT FLUORO GUIDE CV LINE
1 series · 2 of 2 positions shown · non-contrast
Comparison: none

INDICATION: ACUTE KIDNEY INJURY

[Series 1: ir right fluoro guide cv line · 2 of 2 slices shown]
[im 1/2]
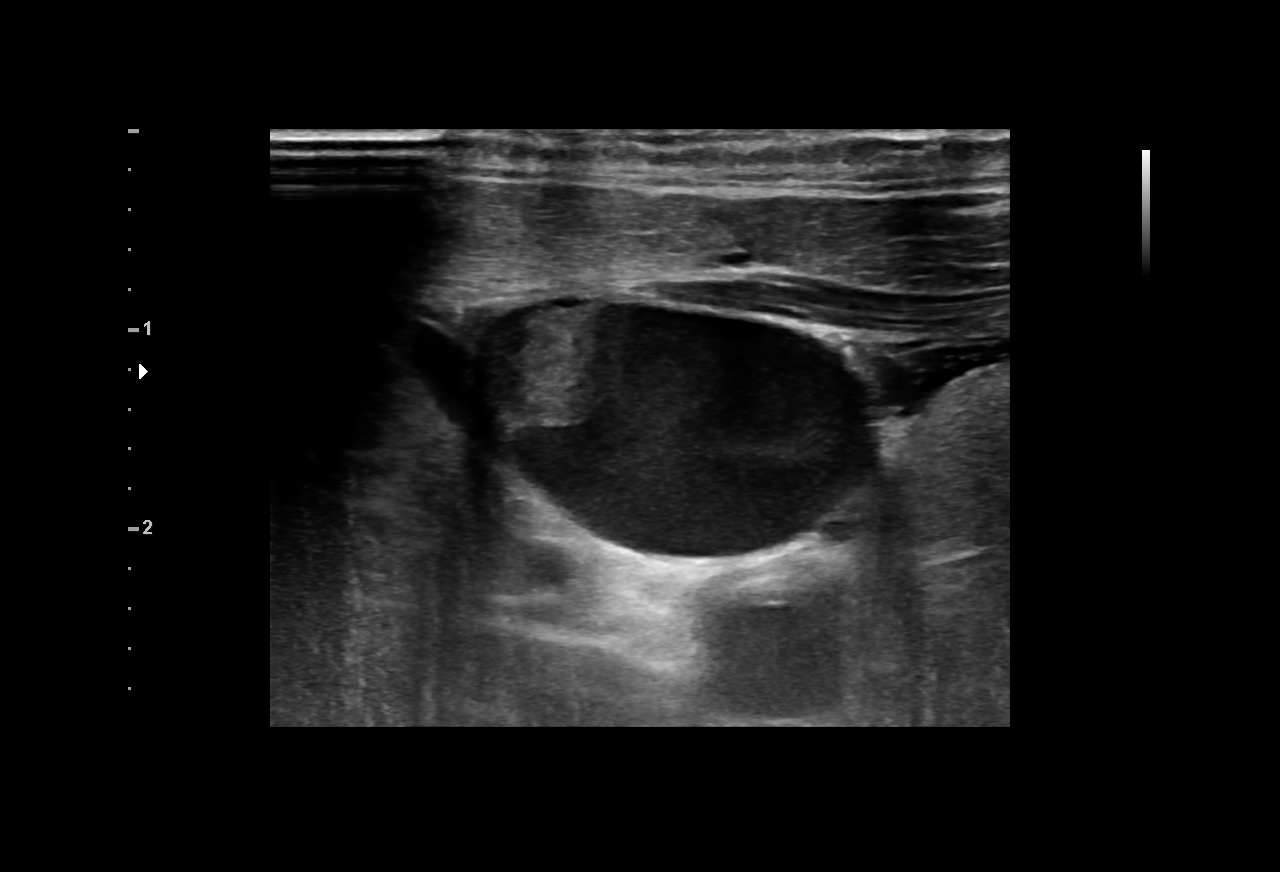
[im 2/2]
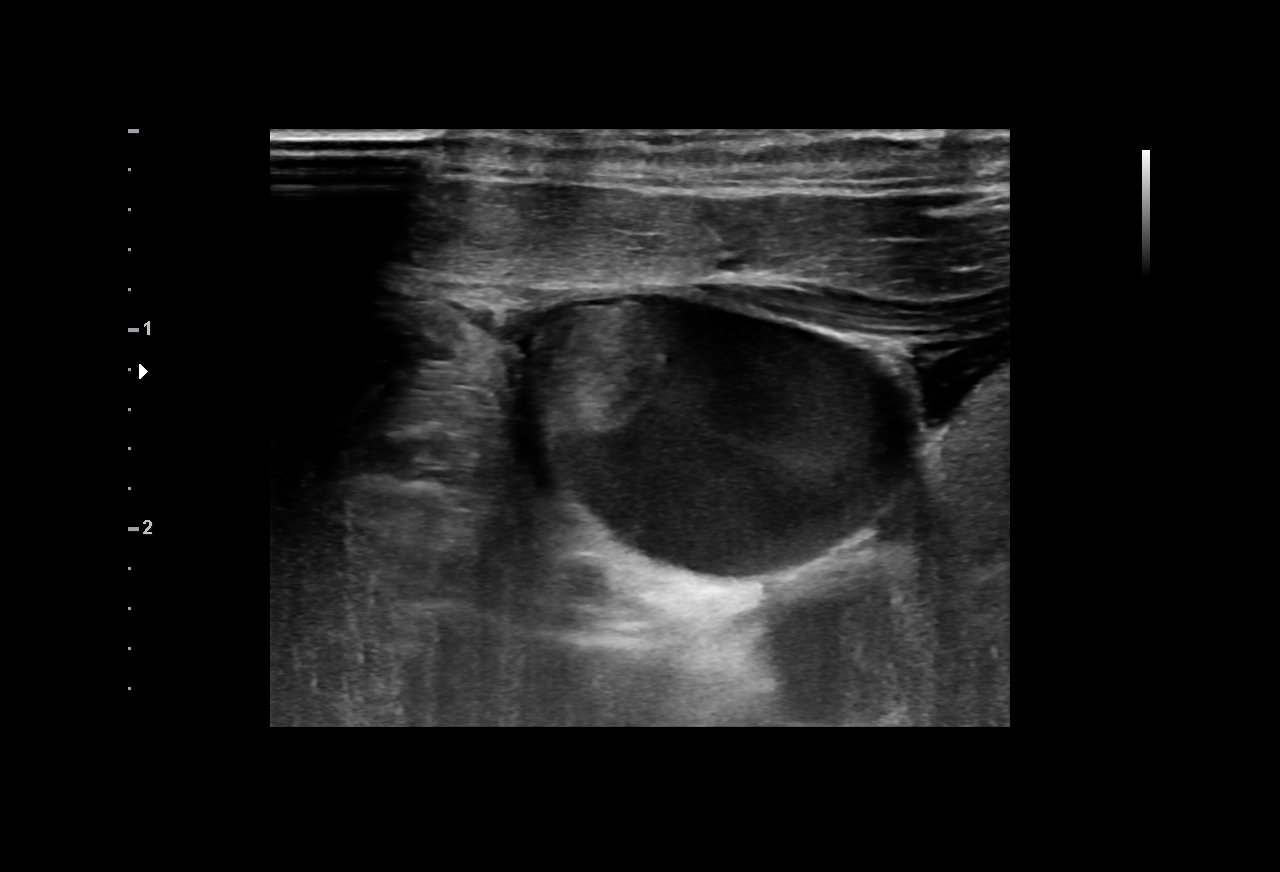

[2 of 2 positions shown; findings below may reference images not displayed]

EXAM:
ULTRASOUND GUIDANCE FOR VASCULAR ACCESS

RIGHT INTERNAL JUGULAR PERMANENT HEMODIALYSIS CATHETER

Radiologist:  Ceola, Ervista

Guidance:  Ultrasound fluoroscopic

FLUOROSCOPY TIME:  Fluoroscopy Time: 2 minutes 6 seconds (4 mGy).

MEDICATIONS:
Ancef 2 g 1 the procedure

ANESTHESIA/SEDATION:
Versed 1.0 mg IV; Fentanyl 50 mcg IV;

Moderate Sedation Time:  20 minutes

The patient was continuously monitored during the procedure by the
interventional radiology nurse under my direct supervision.

CONTRAST:  None.

COMPLICATIONS:
None immediate.

PROCEDURE:
Informed consent was obtained from the patient following explanation
of the procedure, risks, benefits and alternatives. The patient
understands, agrees and consents for the procedure. All questions
were addressed. A time out was performed.

Maximal barrier sterile technique utilized including caps, mask,
sterile gowns, sterile gloves, large sterile drape, hand hygiene,
and 2% chlorhexidine scrub.

Under sterile conditions and local anesthesia, right internal
jugular micropuncture venous access was performed with ultrasound.
Images were obtained for documentation of the patent right internal
jugular vein. A guide wire was inserted followed by a transitional
dilator. Next, a 0.035 guidewire was advanced into the IVC with a
5-French catheter. Measurements were obtained from the right
venotomy site to the proximal right atrium. In the infraclavicular
chest, a subcutaneous tunnel was created under sterile conditions
and local anesthesia. 1% lidocaine with epinephrine was utilized for
this. The 19 cm tip to cuff palindrome catheter was tunneled
subcutaneously to the venotomy site and inserted into the SVC/RA
junction through a valved peel-away sheath. Position was confirmed
with fluoroscopy. Images were obtained for documentation. Blood was
aspirated from the catheter followed by saline and heparin flushes.
The appropriate volume and strength of heparin was instilled in each
lumen. Caps were applied. The catheter was secured at the tunnel
site with Gelfoam and a pursestring suture. The venotomy site was
closed with subcuticular Vicryl suture. Dermabond was applied to the
small right neck incision. A dry sterile dressing was applied. The
catheter is ready for use. No immediate complications.
IMPRESSION: Ultrasound and fluoroscopically guided right internal jugular
tunneled hemodialysis catheter (19 cm tip cuff palindrome catheter).
